# Patient Record
Sex: Female | Born: 1945 | Race: White | Hispanic: No | Smoking: Former smoker
Health system: Southern US, Community
[De-identification: ages and names within clinical notes are randomized; demographics above are authoritative.]

## PROBLEM LIST (undated history)

## (undated) DIAGNOSIS — I96 Gangrene, not elsewhere classified: Secondary | ICD-10-CM

## (undated) DIAGNOSIS — J8410 Pulmonary fibrosis, unspecified: Secondary | ICD-10-CM

## (undated) DIAGNOSIS — R5381 Other malaise: Secondary | ICD-10-CM

## (undated) DIAGNOSIS — C44321 Squamous cell carcinoma of skin of nose: Secondary | ICD-10-CM

## (undated) DIAGNOSIS — S3609XA Other injury of spleen, initial encounter: Secondary | ICD-10-CM

## (undated) DIAGNOSIS — D126 Benign neoplasm of colon, unspecified: Secondary | ICD-10-CM

## (undated) DIAGNOSIS — L719 Rosacea, unspecified: Secondary | ICD-10-CM

## (undated) DIAGNOSIS — A419 Sepsis, unspecified organism: Secondary | ICD-10-CM

## (undated) DIAGNOSIS — M791 Myalgia, unspecified site: Secondary | ICD-10-CM

## (undated) DIAGNOSIS — E785 Hyperlipidemia, unspecified: Secondary | ICD-10-CM

## (undated) DIAGNOSIS — M810 Age-related osteoporosis without current pathological fracture: Secondary | ICD-10-CM

## (undated) DIAGNOSIS — D65 Disseminated intravascular coagulation [defibrination syndrome]: Secondary | ICD-10-CM

## (undated) DIAGNOSIS — D75839 Thrombocytosis, unspecified: Secondary | ICD-10-CM

## (undated) DIAGNOSIS — I10 Essential (primary) hypertension: Secondary | ICD-10-CM

## (undated) DIAGNOSIS — R011 Cardiac murmur, unspecified: Secondary | ICD-10-CM

## (undated) DIAGNOSIS — M86679 Other chronic osteomyelitis, unspecified ankle and foot: Secondary | ICD-10-CM

## (undated) HISTORY — PX: OTHER SURGICAL HISTORY: SHX169

## (undated) HISTORY — PX: BREAST CYST ASPIRATION: SHX578

## (undated) HISTORY — PX: AMPUTATION TOE: SHX6595

## (undated) HISTORY — DX: Squamous cell carcinoma of skin of nose: C44.321

---

## 2002-12-24 HISTORY — PX: OTHER SURGICAL HISTORY: SHX169

## 2006-01-02 ENCOUNTER — Ambulatory Visit: Payer: Self-pay | Admitting: Gastroenterology

## 2008-01-22 ENCOUNTER — Ambulatory Visit: Payer: Self-pay | Admitting: Family Medicine

## 2009-01-31 ENCOUNTER — Ambulatory Visit: Payer: Self-pay | Admitting: Family Medicine

## 2012-03-11 ENCOUNTER — Ambulatory Visit: Payer: Self-pay | Admitting: Family Medicine

## 2012-07-11 ENCOUNTER — Ambulatory Visit: Payer: Self-pay | Admitting: Gastroenterology

## 2012-07-15 LAB — PATHOLOGY REPORT

## 2014-04-23 HISTORY — PX: AMPUTATION FINGER / THUMB: SUR24

## 2014-05-01 DIAGNOSIS — N179 Acute kidney failure, unspecified: Secondary | ICD-10-CM | POA: Diagnosis present

## 2014-06-08 DIAGNOSIS — I10 Essential (primary) hypertension: Secondary | ICD-10-CM | POA: Diagnosis present

## 2014-11-04 ENCOUNTER — Ambulatory Visit: Payer: Self-pay | Admitting: Family Medicine

## 2015-05-03 ENCOUNTER — Other Ambulatory Visit: Payer: Self-pay | Admitting: Family Medicine

## 2015-05-03 DIAGNOSIS — N63 Unspecified lump in unspecified breast: Secondary | ICD-10-CM

## 2015-05-05 ENCOUNTER — Ambulatory Visit
Admission: RE | Admit: 2015-05-05 | Discharge: 2015-05-05 | Disposition: A | Discharge status: Home / Self Care | Payer: Commercial Managed Care - HMO | Source: Ambulatory Visit | Attending: Family Medicine | Admitting: Family Medicine

## 2015-05-05 DIAGNOSIS — R928 Other abnormal and inconclusive findings on diagnostic imaging of breast: Secondary | ICD-10-CM | POA: Diagnosis not present

## 2015-05-05 DIAGNOSIS — N63 Unspecified lump in unspecified breast: Secondary | ICD-10-CM

## 2016-12-24 HISTORY — PX: OTHER SURGICAL HISTORY: SHX169

## 2019-04-26 DIAGNOSIS — E538 Deficiency of other specified B group vitamins: Secondary | ICD-10-CM | POA: Diagnosis present

## 2019-12-25 DIAGNOSIS — C801 Malignant (primary) neoplasm, unspecified: Secondary | ICD-10-CM

## 2019-12-25 HISTORY — DX: Malignant (primary) neoplasm, unspecified: C80.1

## 2020-08-26 ENCOUNTER — Other Ambulatory Visit
Admission: RE | Admit: 2020-08-26 | Discharge: 2020-08-26 | Disposition: A | Discharge status: Home / Self Care | Payer: Medicare Other | Source: Ambulatory Visit | Attending: General Surgery | Admitting: General Surgery

## 2020-08-26 ENCOUNTER — Other Ambulatory Visit: Payer: Self-pay

## 2020-08-26 DIAGNOSIS — Z01818 Encounter for other preprocedural examination: Secondary | ICD-10-CM | POA: Diagnosis present

## 2020-08-26 DIAGNOSIS — Z20822 Contact with and (suspected) exposure to covid-19: Secondary | ICD-10-CM | POA: Insufficient documentation

## 2020-08-27 LAB — SARS CORONAVIRUS 2 (TAT 6-24 HRS): SARS Coronavirus 2: NEGATIVE

## 2020-08-30 ENCOUNTER — Encounter: Payer: Self-pay | Admitting: General Surgery
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
## 2020-08-31 ENCOUNTER — Encounter
Admission: RE | Disposition: A | Discharge status: Home / Self Care | Payer: Self-pay | Source: Home / Self Care | Attending: General Surgery

## 2020-08-31 ENCOUNTER — Other Ambulatory Visit: Payer: Self-pay

## 2020-08-31 ENCOUNTER — Ambulatory Visit
Admission: RE | Admit: 2020-08-31 | Discharge: 2020-08-31 | Disposition: A | Discharge status: Home / Self Care | Payer: Medicare Other | Attending: General Surgery | Admitting: General Surgery

## 2020-08-31 ENCOUNTER — Ambulatory Visit: Payer: Medicare Other | Admitting: Certified Registered"

## 2020-08-31 DIAGNOSIS — E785 Hyperlipidemia, unspecified: Secondary | ICD-10-CM | POA: Diagnosis not present

## 2020-08-31 DIAGNOSIS — Z7983 Long term (current) use of bisphosphonates: Secondary | ICD-10-CM | POA: Insufficient documentation

## 2020-08-31 DIAGNOSIS — I10 Essential (primary) hypertension: Secondary | ICD-10-CM | POA: Insufficient documentation

## 2020-08-31 DIAGNOSIS — Z8371 Family history of colonic polyps: Secondary | ICD-10-CM | POA: Insufficient documentation

## 2020-08-31 DIAGNOSIS — Z86010 Personal history of colonic polyps: Secondary | ICD-10-CM | POA: Insufficient documentation

## 2020-08-31 DIAGNOSIS — Z1211 Encounter for screening for malignant neoplasm of colon: Secondary | ICD-10-CM | POA: Diagnosis not present

## 2020-08-31 DIAGNOSIS — Z79899 Other long term (current) drug therapy: Secondary | ICD-10-CM | POA: Diagnosis not present

## 2020-08-31 DIAGNOSIS — Z89439 Acquired absence of unspecified foot: Secondary | ICD-10-CM | POA: Insufficient documentation

## 2020-08-31 DIAGNOSIS — Z87891 Personal history of nicotine dependence: Secondary | ICD-10-CM | POA: Insufficient documentation

## 2020-08-31 DIAGNOSIS — Z89022 Acquired absence of left finger(s): Secondary | ICD-10-CM | POA: Insufficient documentation

## 2020-08-31 DIAGNOSIS — M1990 Unspecified osteoarthritis, unspecified site: Secondary | ICD-10-CM | POA: Diagnosis not present

## 2020-08-31 DIAGNOSIS — M810 Age-related osteoporosis without current pathological fracture: Secondary | ICD-10-CM | POA: Insufficient documentation

## 2020-08-31 DIAGNOSIS — Z89021 Acquired absence of right finger(s): Secondary | ICD-10-CM | POA: Diagnosis not present

## 2020-08-31 HISTORY — DX: Sepsis, unspecified organism: A41.9

## 2020-08-31 HISTORY — DX: Gangrene, not elsewhere classified: I96

## 2020-08-31 HISTORY — DX: Pulmonary fibrosis, unspecified: J84.10

## 2020-08-31 HISTORY — DX: Other malaise: R53.81

## 2020-08-31 HISTORY — DX: Benign neoplasm of colon, unspecified: D12.6

## 2020-08-31 HISTORY — DX: Age-related osteoporosis without current pathological fracture: M81.0

## 2020-08-31 HISTORY — DX: Other injury of spleen, initial encounter: S36.09XA

## 2020-08-31 HISTORY — PX: COLONOSCOPY WITH PROPOFOL: SHX5780

## 2020-08-31 HISTORY — DX: Myalgia, unspecified site: M79.10

## 2020-08-31 HISTORY — DX: Disseminated intravascular coagulation (defibrination syndrome): D65

## 2020-08-31 HISTORY — DX: Thrombocytosis, unspecified: D75.839

## 2020-08-31 HISTORY — DX: Rosacea, unspecified: L71.9

## 2020-08-31 HISTORY — DX: Other chronic osteomyelitis, unspecified ankle and foot: M86.679

## 2020-08-31 HISTORY — DX: Hyperlipidemia, unspecified: E78.5

## 2020-08-31 SURGERY — COLONOSCOPY WITH PROPOFOL
Anesthesia: General

## 2020-08-31 MED ORDER — PROPOFOL 500 MG/50ML IV EMUL
INTRAVENOUS | Status: DC | PRN
  Administered 2020-08-31: 125 ug/kg/min via INTRAVENOUS

## 2020-08-31 MED ORDER — PROPOFOL 500 MG/50ML IV EMUL
INTRAVENOUS | Status: AC
  Filled 2020-08-31: qty 50

## 2020-08-31 MED ORDER — PROPOFOL 10 MG/ML IV BOLUS
INTRAVENOUS | Status: DC | PRN
  Administered 2020-08-31: 50 mg via INTRAVENOUS

## 2020-08-31 MED ORDER — LIDOCAINE HCL (CARDIAC) PF 100 MG/5ML IV SOSY
PREFILLED_SYRINGE | INTRAVENOUS | Status: DC | PRN
  Administered 2020-08-31: 50 mg via INTRAVENOUS

## 2020-08-31 MED ORDER — SODIUM CHLORIDE 0.9 % IV SOLN
INTRAVENOUS | Status: DC
  Administered 2020-08-31: 20 mL/h via INTRAVENOUS

## 2020-08-31 NOTE — Anesthesia Postprocedure Evaluation (Signed)
Anesthesia Post Note  Patient: Connie Doyle  Procedure(s) Performed: COLONOSCOPY WITH PROPOFOL (N/A )  Patient location during evaluation: Endoscopy Anesthesia Type: General Level of consciousness: awake and alert Pain management: pain level controlled Vital Signs Assessment: post-procedure vital signs reviewed and stable Respiratory status: spontaneous breathing and respiratory function stable Cardiovascular status: stable Anesthetic complications: no   No complications documented.   Last Vitals:  Vitals:   08/31/20 0939 08/31/20 0944  BP: (!) 122/56 (!) 127/56  Pulse: (!) 55 (!) 56  Resp: 19 16  Temp:    SpO2: 99% 100%    Last Pain:  Vitals:   08/31/20 0944  TempSrc:   PainSc: 0-No pain                 [name]

## 2020-08-31 NOTE — Op Note (Signed)
[facility] Gastroenterology Patient Name: Connie Doyle Procedure Date: 08/31/2020 8:49 AM MRN: [id number] Account #: [id number] Date of Birth: [birth year] Admit Type: Inpatient Age: 74 Room: [facility] ENDO ROOM 1 Gender: Female Note Status: Finalized Procedure:             Colonoscopy Indications:           High risk colon cancer surveillance: Personal history                         of colonic polyps Providers:             Robert Bellow, MD Referring MD:          Shirline Frees MD, MD (Referring MD) Medicines:             Monitored Anesthesia Care Complications:         No immediate complications. Procedure:             Pre-Anesthesia Assessment:                        - Prior to the procedure, a History and Physical was                         performed, and patient medications, allergies and                         sensitivities were reviewed. The patient's tolerance                         of previous anesthesia was reviewed.                        - The risks and benefits of the procedure and the                         sedation options and risks were discussed with the                         patient. All questions were answered and informed                         consent was obtained.                        After obtaining informed consent, the colonoscope was                         passed under direct vision. Throughout the procedure,                         the patient's blood pressure, pulse, and oxygen                         saturations were monitored continuously. The                         Colonoscope was introduced through the anus and                         advanced to the the  terminal ileum. The colonoscopy                         was performed without difficulty. The patient                         tolerated the procedure well. The quality of the bowel                         preparation was excellent. Findings:      A single medium-sized  localized angioectasia without bleeding was found       in the cecum. Impression:            - The entire examined colon is normal on direct and                         retroflexion views.                        - The entire examined colon is normal on direct and                         retroflexion views.                        - No specimens collected. Recommendation:        - Repeat colonoscopy in 5 years for surveillance. Procedure Code(s):     --- Professional ---                        [phone], Colonoscopy, flexible; diagnostic, including                         collection of specimen(s) by brushing or washing, when                         performed (separate procedure) Diagnosis Code(s):     --- Professional ---                        Z86.010, Personal history of colonic polyps CPT copyright 2019 American Medical Association. All rights reserved. The codes documented in this report are preliminary and upon coder review may  be revised to meet current compliance requirements. Robert Bellow, MD 08/31/2020 9:27:00 AM This report has been signed electronically. Number of Addenda: 0 Note Initiated On: 08/31/2020 8:49 AM Scope Withdrawal Time: 0 hours 7 minutes 28 seconds  Total Procedure Duration: 0 hours 16 minutes 20 seconds  Estimated Blood Loss:  Estimated blood loss: none.      [facility]

## 2020-08-31 NOTE — Anesthesia Procedure Notes (Signed)
Date/Time: 08/31/2020 9:01 AM Performed by: Jerrye Noble, CRNA Pre-anesthesia Checklist: Patient identified, Emergency Drugs available, Suction available and Patient being monitored Patient Re-evaluated:Patient Re-evaluated prior to induction Oxygen Delivery Method: Nasal cannula

## 2020-08-31 NOTE — Anesthesia Preprocedure Evaluation (Signed)
Anesthesia Evaluation  Patient identified by MRN, date of birth, ID band Patient awake    Reviewed: Allergy & Precautions, NPO status , Patient's Chart, lab work & pertinent test results  History of Anesthesia Complications Negative for: history of anesthetic complications  Airway Mallampati: II       Dental   Pulmonary neg sleep apnea, neg COPD, Not current smoker, former smoker,           Cardiovascular (-) hypertension(-) Past MI and (-) CHF (-) dysrhythmias (-) Valvular Problems/Murmurs     Neuro/Psych neg Seizures    GI/Hepatic Neg liver ROS, neg GERD  ,  Endo/Other  neg diabetes  Renal/GU negative Renal ROS     Musculoskeletal   Abdominal   Peds  Hematology   Anesthesia Other Findings   Reproductive/Obstetrics                             Anesthesia Physical Anesthesia Plan  ASA: II  Anesthesia Plan: General   Post-op Pain Management:    Induction: Intravenous  PONV Risk Score and Plan: 3 and Propofol infusion, TIVA and Treatment may vary due to age or medical condition  Airway Management Planned: Nasal Cannula  Additional Equipment:   Intra-op Plan:   Post-operative Plan:   Informed Consent: I have reviewed the patients History and Physical, chart, labs and discussed the procedure including the risks, benefits and alternatives for the proposed anesthesia with the patient or authorized representative who has indicated his/her understanding and acceptance.       Plan Discussed with:   Anesthesia Plan Comments:         Anesthesia Quick Evaluation

## 2020-08-31 NOTE — Transfer of Care (Signed)
Immediate Anesthesia Transfer of Care Note  Patient: Connie Doyle  Procedure(s) Performed: COLONOSCOPY WITH PROPOFOL (N/A )  Patient Location: PACU and Endoscopy Unit  Anesthesia Type:General  Level of Consciousness: awake and drowsy  Airway & Oxygen Therapy: Patient Spontanous Breathing  Post-op Assessment: Report given to RN and Post -op Vital signs reviewed and stable  Post vital signs: Reviewed and stable  Last Vitals:  Vitals Value Taken Time  BP 96/44 08/31/20 0929  Temp 36.6 C 08/31/20 0929  Pulse 60 08/31/20 0929  Resp 18 08/31/20 0929  SpO2 100 % 08/31/20 0929    Last Pain:  Vitals:   08/31/20 0833  TempSrc: Temporal  PainSc: 1          Complications: No complications documented.
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
## 2020-09-01 ENCOUNTER — Encounter: Payer: Self-pay | Admitting: General Surgery

## 2021-07-31 DIAGNOSIS — G8929 Other chronic pain: Secondary | ICD-10-CM | POA: Insufficient documentation

## 2021-08-08 ENCOUNTER — Other Ambulatory Visit: Payer: Self-pay | Admitting: Physical Medicine & Rehabilitation

## 2021-08-08 DIAGNOSIS — G8929 Other chronic pain: Secondary | ICD-10-CM

## 2021-08-08 DIAGNOSIS — M5442 Lumbago with sciatica, left side: Secondary | ICD-10-CM

## 2021-08-15 ENCOUNTER — Ambulatory Visit
Admission: RE | Admit: 2021-08-15 | Discharge: 2021-08-15 | Disposition: A | Discharge status: Home / Self Care | Payer: Medicare Other | Source: Ambulatory Visit | Attending: Physical Medicine & Rehabilitation | Admitting: Physical Medicine & Rehabilitation

## 2021-08-15 DIAGNOSIS — G8929 Other chronic pain: Secondary | ICD-10-CM | POA: Insufficient documentation

## 2021-08-15 DIAGNOSIS — M5442 Lumbago with sciatica, left side: Secondary | ICD-10-CM | POA: Insufficient documentation

## 2021-10-30 DIAGNOSIS — R413 Other amnesia: Secondary | ICD-10-CM | POA: Insufficient documentation

## 2021-10-31 ENCOUNTER — Other Ambulatory Visit: Payer: Self-pay | Admitting: Gerontology

## 2021-10-31 DIAGNOSIS — Z1231 Encounter for screening mammogram for malignant neoplasm of breast: Secondary | ICD-10-CM

## 2021-12-24 HISTORY — PX: SKIN CANCER EXCISION: SHX779

## 2022-03-19 ENCOUNTER — Other Ambulatory Visit: Payer: Self-pay | Admitting: Neurosurgery

## 2022-03-19 DIAGNOSIS — Z01818 Encounter for other preprocedural examination: Secondary | ICD-10-CM

## 2022-03-27 ENCOUNTER — Observation Stay
Admission: EM | Admit: 2022-03-27 | Discharge: 2022-03-28 | Disposition: A | Discharge status: Home / Self Care | Payer: Medicare Other | Attending: Internal Medicine | Admitting: Internal Medicine

## 2022-03-27 ENCOUNTER — Emergency Department: Payer: Medicare Other

## 2022-03-27 ENCOUNTER — Other Ambulatory Visit: Payer: Self-pay

## 2022-03-27 ENCOUNTER — Observation Stay: Payer: Medicare Other

## 2022-03-27 ENCOUNTER — Encounter: Payer: Self-pay | Admitting: Emergency Medicine

## 2022-03-27 DIAGNOSIS — R259 Unspecified abnormal involuntary movements: Secondary | ICD-10-CM | POA: Diagnosis present

## 2022-03-27 DIAGNOSIS — R410 Disorientation, unspecified: Secondary | ICD-10-CM | POA: Diagnosis not present

## 2022-03-27 DIAGNOSIS — E538 Deficiency of other specified B group vitamins: Secondary | ICD-10-CM | POA: Diagnosis not present

## 2022-03-27 DIAGNOSIS — Z87891 Personal history of nicotine dependence: Secondary | ICD-10-CM | POA: Insufficient documentation

## 2022-03-27 DIAGNOSIS — R748 Abnormal levels of other serum enzymes: Secondary | ICD-10-CM | POA: Insufficient documentation

## 2022-03-27 DIAGNOSIS — I10 Essential (primary) hypertension: Secondary | ICD-10-CM | POA: Diagnosis not present

## 2022-03-27 DIAGNOSIS — N178 Other acute kidney failure: Secondary | ICD-10-CM | POA: Diagnosis not present

## 2022-03-27 DIAGNOSIS — N179 Acute kidney failure, unspecified: Secondary | ICD-10-CM | POA: Diagnosis present

## 2022-03-27 DIAGNOSIS — E559 Vitamin D deficiency, unspecified: Secondary | ICD-10-CM | POA: Diagnosis not present

## 2022-03-27 DIAGNOSIS — Z79899 Other long term (current) drug therapy: Secondary | ICD-10-CM | POA: Diagnosis not present

## 2022-03-27 LAB — URINE DRUG SCREEN, QUALITATIVE (ARMC ONLY)
Amphetamines, Ur Screen: NOT DETECTED
Barbiturates, Ur Screen: NOT DETECTED
Benzodiazepine, Ur Scrn: NOT DETECTED
Cannabinoid 50 Ng, Ur ~~LOC~~: NOT DETECTED
Cocaine Metabolite,Ur ~~LOC~~: NOT DETECTED
MDMA (Ecstasy)Ur Screen: NOT DETECTED
Methadone Scn, Ur: NOT DETECTED
Opiate, Ur Screen: NOT DETECTED
Phencyclidine (PCP) Ur S: NOT DETECTED
Tricyclic, Ur Screen: NOT DETECTED

## 2022-03-27 LAB — URINALYSIS, ROUTINE W REFLEX MICROSCOPIC
Bilirubin Urine: NEGATIVE
Glucose, UA: NEGATIVE mg/dL
Hgb urine dipstick: NEGATIVE
Ketones, ur: NEGATIVE mg/dL
Leukocytes,Ua: NEGATIVE
Nitrite: NEGATIVE
Protein, ur: NEGATIVE mg/dL
Specific Gravity, Urine: 1.008 (ref 1.005–1.030)
pH: 7 (ref 5.0–8.0)

## 2022-03-27 LAB — COMPREHENSIVE METABOLIC PANEL
ALT: 31 U/L (ref 0–44)
AST: 38 U/L (ref 15–41)
Albumin: 4.3 g/dL (ref 3.5–5.0)
Alkaline Phosphatase: 54 U/L (ref 38–126)
Anion gap: 11 (ref 5–15)
BUN: 13 mg/dL (ref 8–23)
CO2: 27 mmol/L (ref 22–32)
Calcium: 9.3 mg/dL (ref 8.9–10.3)
Chloride: 99 mmol/L (ref 98–111)
Creatinine, Ser: 1.08 mg/dL — ABNORMAL HIGH (ref 0.44–1.00)
GFR, Estimated: 54 mL/min — ABNORMAL LOW (ref >60)
Glucose, Bld: 133 mg/dL — ABNORMAL HIGH (ref 70–99)
Potassium: 3.9 mmol/L (ref 3.5–5.1)
Sodium: 137 mmol/L (ref 135–145)
Total Bilirubin: 0.8 mg/dL (ref 0.3–1.2)
Total Protein: 7.6 g/dL (ref 6.5–8.1)

## 2022-03-27 LAB — CBC WITH DIFFERENTIAL/PLATELET
Abs Immature Granulocytes: 0.02 10*3/uL (ref 0.00–0.07)
Basophils Absolute: 0 10*3/uL (ref 0.0–0.1)
Basophils Relative: 1 %
Eosinophils Absolute: 0.1 10*3/uL (ref 0.0–0.5)
Eosinophils Relative: 2 %
HCT: 35.6 % — ABNORMAL LOW (ref 36.0–46.0)
Hemoglobin: 12 g/dL (ref 12.0–15.0)
Immature Granulocytes: 0 %
Lymphocytes Relative: 35 %
Lymphs Abs: 1.8 10*3/uL (ref 0.7–4.0)
MCH: 31.8 pg (ref 26.0–34.0)
MCHC: 33.7 g/dL (ref 30.0–36.0)
MCV: 94.4 fL (ref 80.0–100.0)
Monocytes Absolute: 0.5 10*3/uL (ref 0.1–1.0)
Monocytes Relative: 9 %
Neutro Abs: 2.7 10*3/uL (ref 1.7–7.7)
Neutrophils Relative %: 53 %
Platelets: 317 10*3/uL (ref 150–400)
RBC: 3.77 MIL/uL — ABNORMAL LOW (ref 3.87–5.11)
RDW: 13.2 % (ref 11.5–15.5)
WBC: 5.1 10*3/uL (ref 4.0–10.5)
nRBC: 0 % (ref 0.0–0.2)

## 2022-03-27 LAB — ETHANOL: Alcohol, Ethyl (B): <10 mg/dL (ref <10)

## 2022-03-27 LAB — T4, FREE: Free T4: 1.13 ng/dL — ABNORMAL HIGH (ref 0.61–1.12)

## 2022-03-27 LAB — SEDIMENTATION RATE: Sed Rate: 17 mm/hr (ref 0–30)

## 2022-03-27 LAB — CK: Total CK: 322 U/L — ABNORMAL HIGH (ref 38–234)

## 2022-03-27 LAB — TSH: TSH: 1.847 u[IU]/mL (ref 0.350–4.500)

## 2022-03-27 LAB — MAGNESIUM: Magnesium: 2 mg/dL (ref 1.7–2.4)

## 2022-03-27 MED ORDER — LOSARTAN POTASSIUM 25 MG PO TABS
25.0000 mg | ORAL_TABLET | Freq: Every day | ORAL | Status: DC
  Administered 2022-03-28: 25 mg via ORAL
  Filled 2022-03-27: qty 1

## 2022-03-27 MED ORDER — LACTATED RINGERS IV SOLN: INTRAVENOUS | Status: DC

## 2022-03-27 MED ORDER — GADOBUTROL 1 MMOL/ML IV SOLN
6.0000 mL | Freq: Once | INTRAVENOUS | Status: AC | PRN
  Administered 2022-03-27: 6 mL via INTRAVENOUS

## 2022-03-27 MED ORDER — PRAVASTATIN SODIUM 20 MG PO TABS
40.0000 mg | ORAL_TABLET | Freq: Every day | ORAL | Status: DC
  Administered 2022-03-28: 40 mg via ORAL
  Filled 2022-03-27: qty 2

## 2022-03-27 MED ORDER — THIAMINE HCL 100 MG/ML IJ SOLN
500.0000 mg | Freq: Every day | INTRAVENOUS | Status: DC
  Administered 2022-03-27 – 2022-03-28 (×2): 500 mg via INTRAVENOUS
  Filled 2022-03-27 (×2): qty 5

## 2022-03-27 MED ORDER — PANTOPRAZOLE SODIUM 40 MG IV SOLR
40.0000 mg | Freq: Two times a day (BID) | INTRAVENOUS | Status: DC
  Administered 2022-03-27: 40 mg via INTRAVENOUS
  Filled 2022-03-27 (×2): qty 10

## 2022-03-27 MED ORDER — ACETAMINOPHEN 325 MG PO TABS
650.0000 mg | ORAL_TABLET | Freq: Four times a day (QID) | ORAL | Status: DC | PRN
  Administered 2022-03-27 – 2022-03-28 (×2): 650 mg via ORAL
  Filled 2022-03-27 (×2): qty 2

## 2022-03-27 MED ORDER — ACETAMINOPHEN 650 MG RE SUPP: 650.0000 mg | Freq: Four times a day (QID) | RECTAL | Status: DC | PRN

## 2022-03-27 MED ORDER — HEPARIN SODIUM (PORCINE) 5000 UNIT/ML IJ SOLN
5000.0000 [IU] | Freq: Three times a day (TID) | INTRAMUSCULAR | Status: DC
  Administered 2022-03-27 – 2022-03-28 (×2): 5000 [IU] via SUBCUTANEOUS
  Filled 2022-03-27 (×2): qty 1

## 2022-03-27 MED ORDER — SODIUM CHLORIDE 0.9% FLUSH
3.0000 mL | Freq: Two times a day (BID) | INTRAVENOUS | Status: DC
  Administered 2022-03-27 – 2022-03-28 (×2): 3 mL via INTRAVENOUS

## 2022-03-27 MED ORDER — DIAZEPAM 2 MG PO TABS
1.0000 mg | ORAL_TABLET | Freq: Three times a day (TID) | ORAL | Status: DC | PRN
  Administered 2022-03-27 – 2022-03-28 (×2): 1 mg via ORAL
  Filled 2022-03-27 (×2): qty 1

## 2022-03-27 MED ORDER — LOSARTAN POTASSIUM 50 MG PO TABS: 50.0000 mg | ORAL_TABLET | Freq: Every day | ORAL | Status: DC

## 2022-03-27 NOTE — Assessment & Plan Note (Signed)
We will check b12 level.  ?
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
## 2022-03-27 NOTE — ED Notes (Signed)
Patient transported to MRI 

## 2022-03-27 NOTE — ED Triage Notes (Signed)
Pt to ED via POV, pt sent from [facility] clinic acute care. Pt family states that pt started having uncontrollable movement that started about 5 hours ago. Pt daughter states that pt seems disoriented to her, pt is able to answer orientation question correctly but does take some time to say what year it is. Pt states that feels like her mind is ok but she just can't stop jerking. Pt daughter states that she had a similar episode a few weeks ago that lasted about 1 hour. Pt states that during that episode she felt popping in her ear as well. Pt is in NAD ?

## 2022-03-27 NOTE — ED Notes (Signed)
Pt advised that we need urine sample when she is able to provide one. Pt given cup ?

## 2022-03-27 NOTE — Assessment & Plan Note (Signed)
Blood pressure 114/62, pulse 78, temperature 98.2 ?F (36.8 ?C), temperature source Oral, resp. rate 16, height '5\' 1"'$  (1.549 m), weight 61.2 kg, SpO2 98 %. ?we will decrease losartan at 25 mg. ? ?

## 2022-03-27 NOTE — ED Provider Notes (Addendum)
? ?[facility] ?Provider Note ? ? ? Event Date/Time  ? First MD Initiated Contact with Patient 03/27/22 1626   ?  (approximate) ? ? ?History  ? ?uncontrollable movement ? ? ?HPI ? ?Connie Doyle is a 76 y.o. female  with this morning having abnormal movements.  Woke up this morning and felt her normal self.  Then she developed the uncontrollable movements of her arms around 11am. She says she cant control the movements.  This is different from baseline. Reports some dizzyness as well with it. No headaches, vision changes, nausea vomiting, diarrhea. No history of movements like this.  Did up her dose of baclofen to '20mg'$  about a month ago.  ? ?Patient's daughter who is with her and reports that she seems a bit more confused than normal and the movements that got better than they were earlier but they are still present. ? ?Patient does report that she does drink 1-2 large beers most days.  She denies any drug use. ? 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  ? ?  ? ? ?Physical Exam  ? ?Triage Vital Signs: ?ED Triage Vitals  ?Enc Vitals Group  ?   BP 03/27/22 1515 119/61  ?   Pulse Rate 03/27/22 1515 82  ?   Resp 03/27/22 1515 16  ?   Temp 03/27/22 1515 98.2 ?F (36.8 ?C)  ?   Temp Source 03/27/22 1515 Oral  ?   SpO2 03/27/22 1515 96 %  ?   Weight 03/27/22 1516 135 lb (61.2 kg)  ?   Height 03/27/22 1516 '5\' 1"'$  (1.549 m)  ?   Head Circumference --   ?   Peak Flow --   ?   Pain Score 03/27/22 1516 0  ?   Pain Loc --   ?   Pain Edu? --   ?   Excl. in [place]? --   ? ? ?Most recent vital signs: ?Vitals:  ? 03/27/22 1515  ?BP: 119/61  ?Pulse: 82  ?Resp: 16  ?Temp: 98.2 ?F (36.8 ?C)  ?SpO2: 96%  ? ? ? ?General: Awake, no distress.  ?CV:  Good peripheral perfusion.  ?Resp:  Normal effort.  ?Abd:  No distention.  ?Other:  CN intact,  ?AO x3, full strength in arms with intention cranial nerves II through XII are intact.  Patient does have some spontaneous movement of her arms and legs. ? ? ? ?ED Results  / Procedures / Treatments  ? ?Labs ?(all labs ordered are listed, but only abnormal results are displayed) ?Labs Reviewed  ?CBC WITH DIFFERENTIAL/PLATELET - Abnormal; Notable for the following components:  ?    Result Value  ? RBC 3.77 (*)   ? HCT 35.6 (*)   ? All other components within normal limits  ?COMPREHENSIVE METABOLIC PANEL - Abnormal; Notable for the following components:  ? Glucose, Bld 133 (*)   ? Creatinine, Ser 1.08 (*)   ? GFR, Estimated 54 (*)   ? All other components within normal limits  ?CK - Abnormal; Notable for the following components:  ? Total CK 322 (*)   ? All other components within normal limits  ?T4, FREE - Abnormal; Notable for the following components:  ? Free T4 1.13 (*)   ? All other components within normal limits  ?MAGNESIUM  ?TSH  ?ETHANOL  ?URINALYSIS, ROUTINE W REFLEX MICROSCOPIC  ?URINE DRUG SCREEN, QUALITATIVE ([facility] ONLY)  ? ? ? ?EKG ? ?My interpretation of EKG: ? ?Afib  with rate of 105, no st elevation, no twi, normal intervals.  ? ?RADIOLOGY ?I have reviewed the CT head personally no evidence of intracranial hemorrhage ? ?PROCEDURES: ? ?Critical Care performed: No ? ?Procedures ? ? ?MEDICATIONS ORDERED IN ED: ?Medications - No data to display ? ? ?IMPRESSION / MDM / ASSESSMENT AND PLAN / ED COURSE  ?I reviewed the triage vital signs and the nursing notes. ?             ?               ? ?Patient comes in with uncontrollable jerking movement.  Not sure this could be related to medications versus electrolyte divorces versus stroke versus seizure. ? ? ?CBC normal ?CMP CR slightly elevated  ?Mag normal  ?CK slightly elevated  ?TSH normal t4 slightly elevated  ?Etoh negative ? ?Work-up so far is reassuring.  Discussed the case with Dr. Shirlean Schlein from neurology who recommends admission for MRI, further blood work to monitor patient.  Also recommended holding gabapentin and baclofen as these could be contributing. ? ?Hospitalist is requesting I   add  a psychiatry consult. Will place  consult  ? ? ? ? ? ?FINAL CLINICAL IMPRESSION(S) / ED DIAGNOSES  ? ?Final diagnoses:  ?Abnormal movement  ? ? ? ?Rx / DC Orders  ? ?ED Discharge Orders   ? ? None  ? ?  ? ? ? ?Note:  This document was prepared using Dragon voice recognition software and may include unintentional dictation errors. ?  ?Vanessa Morrison, MD ?03/27/22 1850 ? ?  ?Vanessa Putney, MD ?03/27/22 1926 ? ?

## 2022-03-27 NOTE — Assessment & Plan Note (Signed)
Pt coming in for bl ue choreiform movements. ?She is alert awake but poor historian./ ?D/d in her case with her neck pain includes c spine djd and or spinal stenosis from compression fractures./  ?D/d  include psychiatric issues ,D/d include focal seizures or TIA, we will admit pt and evaluate thoroughly and get psych consult as well.  ?Vit b/ d def and inflammatory etiologies.  ?Greatly appreciate neurology consult and management.  ?Will consider spep/ upepe/ copper/ iron levels.  ?Prn valium for her anxiety as well.  ?  ?

## 2022-03-27 NOTE — Assessment & Plan Note (Signed)
Lab Results  ?Component Value Date  ? CREATININE 1.08 (H) 03/27/2022  ?we will follow and monitor and gentle Hydration overnight.  ? ?
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
## 2022-03-28 DIAGNOSIS — R259 Unspecified abnormal involuntary movements: Secondary | ICD-10-CM | POA: Diagnosis not present

## 2022-03-28 DIAGNOSIS — I10 Essential (primary) hypertension: Secondary | ICD-10-CM

## 2022-03-28 DIAGNOSIS — R748 Abnormal levels of other serum enzymes: Secondary | ICD-10-CM | POA: Diagnosis present

## 2022-03-28 LAB — CBC
HCT: 36.6 % (ref 36.0–46.0)
Hemoglobin: 12.2 g/dL (ref 12.0–15.0)
MCH: 31.6 pg (ref 26.0–34.0)
MCHC: 33.3 g/dL (ref 30.0–36.0)
MCV: 94.8 fL (ref 80.0–100.0)
Platelets: 314 10*3/uL (ref 150–400)
RBC: 3.86 MIL/uL — ABNORMAL LOW (ref 3.87–5.11)
RDW: 13.4 % (ref 11.5–15.5)
WBC: 4.5 10*3/uL (ref 4.0–10.5)
nRBC: 0 % (ref 0.0–0.2)

## 2022-03-28 LAB — COMPREHENSIVE METABOLIC PANEL
ALT: 27 U/L (ref 0–44)
AST: 32 U/L (ref 15–41)
Albumin: 4.2 g/dL (ref 3.5–5.0)
Alkaline Phosphatase: 49 U/L (ref 38–126)
Anion gap: 8 (ref 5–15)
BUN: 15 mg/dL (ref 8–23)
CO2: 27 mmol/L (ref 22–32)
Calcium: 9 mg/dL (ref 8.9–10.3)
Chloride: 107 mmol/L (ref 98–111)
Creatinine, Ser: 1.09 mg/dL — ABNORMAL HIGH (ref 0.44–1.00)
GFR, Estimated: 53 mL/min — ABNORMAL LOW (ref >60)
Glucose, Bld: 80 mg/dL (ref 70–99)
Potassium: 3.9 mmol/L (ref 3.5–5.1)
Sodium: 142 mmol/L (ref 135–145)
Total Bilirubin: 0.7 mg/dL (ref 0.3–1.2)
Total Protein: 7.2 g/dL (ref 6.5–8.1)

## 2022-03-28 LAB — VITAMIN B12: Vitamin B-12: 405 pg/mL (ref 180–914)

## 2022-03-28 LAB — FERRITIN: Ferritin: 28 ng/mL (ref 11–307)

## 2022-03-28 LAB — CK: Total CK: 263 U/L — ABNORMAL HIGH (ref 38–234)

## 2022-03-28 LAB — TSH: TSH: 2.12 u[IU]/mL (ref 0.350–4.500)

## 2022-03-28 MED ORDER — HYDRALAZINE HCL 20 MG/ML IJ SOLN
10.0000 mg | Freq: Once | INTRAMUSCULAR | Status: AC
  Administered 2022-03-28: 10 mg via INTRAVENOUS
  Filled 2022-03-28: qty 1

## 2022-03-28 MED ORDER — LORAZEPAM 1 MG PO TABS
1.0000 mg | ORAL_TABLET | Freq: Once | ORAL | Status: AC
  Administered 2022-03-28: 1 mg via ORAL
  Filled 2022-03-28: qty 1

## 2022-03-28 MED ORDER — PANTOPRAZOLE SODIUM 40 MG PO TBEC: 40.0000 mg | DELAYED_RELEASE_TABLET | Freq: Two times a day (BID) | ORAL | Status: DC

## 2022-03-28 MED ORDER — BACLOFEN 10 MG PO TABS: 10.0000 mg | ORAL_TABLET | Freq: Three times a day (TID) | ORAL | 0 refills | Status: DC | PRN

## 2022-03-28 MED ORDER — MELOXICAM 7.5 MG PO TABS
7.5000 mg | ORAL_TABLET | Freq: Two times a day (BID) | ORAL | Status: DC
  Administered 2022-03-28: 7.5 mg via ORAL
  Filled 2022-03-28: qty 1

## 2022-03-28 MED ORDER — BACLOFEN 10 MG PO TABS: 20.0000 mg | ORAL_TABLET | Freq: Three times a day (TID) | ORAL | Status: DC | PRN

## 2022-03-28 NOTE — Assessment & Plan Note (Signed)
No reports of trauma. ?We will continue with gentle hydration overnight. ?
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
## 2022-03-28 NOTE — Plan of Care (Signed)

## 2022-03-28 NOTE — Plan of Care (Signed)
?  Problem: Education: ?Goal: Knowledge of General Education information will improve ?Description: Including pain rating scale, medication(s)/side effects and non-pharmacologic comfort measures ?03/28/2022 1401 by Connie Doyle, Connie Flaming, Connie Doyle ?Outcome: Adequate for Discharge ?03/28/2022 0800 by Kitty Cadavid, Connie Flaming, Connie Doyle ?Outcome: Progressing ?  ?Problem: Health Behavior/Discharge Planning: ?Goal: Ability to manage health-related needs will improve ?03/28/2022 1401 by Connie Doyle, Connie Flaming, Connie Doyle ?Outcome: Adequate for Discharge ?03/28/2022 0800 by Meng Winterton, Connie Flaming, Connie Doyle ?Outcome: Progressing ?  ?Problem: Clinical Measurements: ?Goal: Ability to maintain clinical measurements within normal limits will improve ?03/28/2022 1401 by Connie Doyle, Connie Flaming, Connie Doyle ?Outcome: Adequate for Discharge ?03/28/2022 0800 by Nikelle Malatesta, Connie Flaming, Connie Doyle ?Outcome: Progressing ?Goal: Will remain free from infection ?03/28/2022 1401 by Connie Doyle, Connie Flaming, Connie Doyle ?Outcome: Adequate for Discharge ?03/28/2022 0800 by Connie Doyle, Connie Flaming, Connie Doyle ?Outcome: Progressing ?Goal: Diagnostic test results will improve ?03/28/2022 1401 by Connie Doyle, Connie Flaming, Connie Doyle ?Outcome: Adequate for Discharge ?03/28/2022 0800 by Connie Doyle, Connie Flaming, Connie Doyle ?Outcome: Progressing ?Goal: Respiratory complications will improve ?03/28/2022 1401 by Zyanya Glaza, Connie Flaming, Connie Doyle ?Outcome: Adequate for Discharge ?03/28/2022 0800 by Connie Doyle, Connie Flaming, Connie Doyle ?Outcome: Progressing ?Goal: Cardiovascular complication will be avoided ?03/28/2022 1401 by Connie Doyle, Connie Flaming, Connie Doyle ?Outcome: Adequate for Discharge ?03/28/2022 0800 by Nijel Flink, Connie Flaming, Connie Doyle ?Outcome: Progressing ?  ?Problem: Activity: ?Goal: Risk for activity intolerance will decrease ?03/28/2022 1401 by Rasa Degrazia, Connie Flaming, Connie Doyle ?Outcome: Adequate for Discharge ?03/28/2022 0800 by Kaisei Gilbo, Connie Flaming, Connie Doyle ?Outcome: Progressing ?  ?Problem: Nutrition: ?Goal: Adequate nutrition will be maintained ?03/28/2022 1401 by Juna Caban, Connie Flaming, Connie Doyle ?Outcome: Adequate for Discharge ?03/28/2022 0800 by Jerimie Mancuso, Connie Flaming, Connie Doyle ?Outcome: Progressing ?  ?Problem: Coping: ?Goal: Level of anxiety will decrease ?03/28/2022 1401 by Ksean Vale, Connie Flaming, Connie Doyle ?Outcome: Adequate for Discharge ?03/28/2022 0800 by Kirstine Jacquin, Connie Flaming, Connie Doyle ?Outcome: Progressing ?  ?Problem: Elimination: ?Goal: Will not experience complications related to bowel motility ?03/28/2022 1401 by Song Myre, Connie Flaming, Connie Doyle ?Outcome: Adequate for Discharge ?03/28/2022 0800 by Artem Bunte, Connie Flaming, Connie Doyle ?Outcome: Progressing ?Goal: Will not experience complications related to urinary retention ?03/28/2022 1401 by Anastasija Anfinson, Connie Flaming, Connie Doyle ?Outcome: Adequate for Discharge ?03/28/2022 0800 by Ekin Pilar, Connie Flaming, Connie Doyle ?Outcome: Progressing ?  ?Problem: Pain Managment: ?Goal: General experience of comfort will improve ?03/28/2022 1401 by Rudy Domek, Connie Flaming, Connie Doyle ?Outcome: Adequate for Discharge ?03/28/2022 0800 by Imonie Tuch, Connie Flaming, Connie Doyle ?Outcome: Progressing ?  ?Problem: Safety: ?Goal: Ability to remain free from injury will improve ?03/28/2022 1401 by Jaydrien Wassenaar, Connie Flaming, Connie Doyle ?Outcome: Adequate for Discharge ?03/28/2022 0800 by Mehlani Blankenburg, Connie Flaming, Connie Doyle ?Outcome: Progressing ?  ?Problem: Skin Integrity: ?Goal: Risk for impaired skin integrity will decrease ?03/28/2022 1401 by Kaylob Wallen, Connie Flaming, Connie Doyle ?Outcome: Adequate for Discharge ?03/28/2022 0800 by Darrick Greenlaw, Connie Flaming, Connie Doyle ?Outcome: Progressing ?  ?

## 2022-03-29 NOTE — Progress Notes (Signed)
?   03/28/22 1112  ?Assess: MEWS Score  ?Temp 98.1 ?F (36.7 ?C)  ?BP (!) 203/88  ?Pulse Rate 66  ?Resp 16  ?SpO2 95 %  ?O2 Device Room Air  ?Assess: MEWS Score  ?MEWS Temp 0  ?MEWS Systolic 2  ?MEWS Pulse 0  ?MEWS RR 0  ?MEWS LOC 0  ?MEWS Score 2  ?MEWS Score Color Yellow  ?Assess: if the MEWS score is Yellow or Red  ?Were vital signs taken at a resting state? Yes  ?Focused Assessment No change from prior assessment  ?Does the patient meet 2 or more of the SIRS criteria? No  ?MEWS guidelines implemented *See Row Information* Yes  ?Treat  ?MEWS Interventions Administered scheduled meds/treatments  ?Pain Scale 0-10  ?Take Vital Signs  ?Increase Vital Sign Frequency  Yellow: Q 2hr X 2 then Q 4hr X 2, if remains yellow, continue Q 4hrs  ?Escalate  ?MEWS: Escalate Yellow: discuss with charge nurse/RN and consider discussing with provider and RRT  ?Notify: Charge Nurse/RN  ?Name of Charge Nurse/RN Notified De Hollingshead RN  ?Date Charge Nurse/RN Notified 03/28/22  ?Time Charge Nurse/RN Notified 1115  ?Notify: Provider  ?Provider Name/Title Lorella Nimrod  ?Date Provider Notified 03/28/22  ?Time Provider Notified 1115  ?Notification Type Call  ?Notification Reason Other (Comment) ?(BP increase)  ?Provider response See new orders  ?Date of Provider Response 03/28/22  ?Time of Provider Response 0115  ?Assess: SIRS CRITERIA  ?SIRS Temperature  0  ?SIRS Pulse 0  ?SIRS Respirations  0  ?SIRS WBC 0  ?SIRS Score Sum  0  ? ? ?

## 2022-03-30 LAB — VITAMIN B1: Vitamin B1 (Thiamine): 335.8 nmol/L — ABNORMAL HIGH (ref 66.5–200.0)

## 2022-04-03 ENCOUNTER — Inpatient Hospital Stay: Admission: RE | Admit: 2022-04-03 | Payer: PRIVATE HEALTH INSURANCE | Source: Ambulatory Visit

## 2022-04-03 LAB — MISC LABCORP TEST (SEND OUT): Labcorp test code: 70115

## 2022-04-03 LAB — 25-HYDROXY VITAMIN D LCMS D2+D3
25-Hydroxy, Vitamin D-2: <1 ng/mL
25-Hydroxy, Vitamin D-3: 98 ng/mL
25-Hydroxy, Vitamin D: 98 ng/mL

## 2022-04-04 ENCOUNTER — Encounter
Admission: RE | Admit: 2022-04-04 | Discharge: 2022-04-04 | Disposition: A | Discharge status: Home / Self Care | Payer: Medicare Other | Source: Ambulatory Visit | Attending: Neurosurgery | Admitting: Neurosurgery

## 2022-04-04 VITALS — BP 156/83 | HR 65 | Temp 97.9°F | Resp 18 | Ht 61.0 in | Wt 130.0 lb

## 2022-04-04 DIAGNOSIS — Z01812 Encounter for preprocedural laboratory examination: Secondary | ICD-10-CM | POA: Insufficient documentation

## 2022-04-04 HISTORY — DX: Cardiac murmur, unspecified: R01.1

## 2022-04-04 HISTORY — DX: Essential (primary) hypertension: I10

## 2022-04-04 LAB — URINALYSIS, ROUTINE W REFLEX MICROSCOPIC
Bilirubin Urine: NEGATIVE
Glucose, UA: NEGATIVE mg/dL
Hgb urine dipstick: NEGATIVE
Ketones, ur: NEGATIVE mg/dL
Leukocytes,Ua: NEGATIVE
Nitrite: NEGATIVE
Protein, ur: NEGATIVE mg/dL
Specific Gravity, Urine: 1.005 (ref 1.005–1.030)
pH: 7 (ref 5.0–8.0)

## 2022-04-04 LAB — SURGICAL PCR SCREEN
MRSA, PCR: NEGATIVE
Staphylococcus aureus: NEGATIVE

## 2022-04-04 LAB — TYPE AND SCREEN
ABO/RH(D): O POS
Antibody Screen: NEGATIVE
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
## 2022-04-04 NOTE — Patient Instructions (Addendum)
?Your procedure is scheduled on: Wednesday April 11, 2022. ?Report to Day Surgery inside [facility] 2nd floor, stop by admissions desk before getting on elevator.  ?To find out your arrival time please call [phone] between 1PM - 3PM on Tuesday April 10, 2022. ? ?Remember: Instructions that are not followed completely may result in serious medical risk,  ?up to and including death, or upon the discretion of your surgeon and anesthesiologist your  ?surgery may need to be rescheduled.  ? ?  _X__ 1. Do not eat food after midnight the night before your procedure. ?                No chewing gum or hard candies. You may drink clear liquids up to 2 hours ?                before you are scheduled to arrive for your surgery- DO not drink clear ?                liquids within 2 hours of the start of your surgery. ?                Clear Liquids include:  water, apple juice without pulp, clear Gatorade, G2 or  ?                Gatorade Zero (avoid Red/Purple/Blue), Black Coffee or Tea (Do not add ?                anything to coffee or tea). ? ?__X__2.  On the morning of surgery brush your teeth with toothpaste and water, you ?               may rinse your mouth with mouthwash if you wish.  Do not swallow any toothpaste or mouthwash. ?   ? _X__ 3.  No Alcohol for 24 hours before or after surgery. ? ? _X__ 4.  Do Not Smoke or use e-cigarettes For 24 Hours Prior to Your Surgery. ?                Do not use any chewable tobacco products for at least 6 hours prior to ?                Surgery. ? ?_X__  5.  Do not use any recreational drugs (marijuana, cocaine, heroin, ecstasy, MDMA or other) ?               For at least one week prior to your surgery.  Combination of these drugs with anesthesia ?               May have life threatening results. ? ?____  6.  Bring all medications with you on the day of surgery if instructed.  ? ?__X__  7.  Notify your doctor if there is any change in your medical  condition  ?    (cold, fever, infections). ?    ?Do not wear jewelry, make-up, hairpins, clips or nail polish. ?Do not wear lotions, powders, or perfumes. You may wear deodorant. ?Do not shave 48 hours prior to surgery. Men may shave face and neck. ?Do not bring valuables to the hospital.   ? ?[facility] is not responsible for any belongings or valuables. ? ?Contacts, dentures or bridgework may not be worn into surgery. ?Leave your suitcase in the car. After surgery it may be brought to your room. ?For patients admitted to the hospital, discharge time is  determined by your ?treatment team. ?  ?Patients discharged the day of surgery will not be allowed to drive home.   ?Make arrangements for someone to be with you for the first 24 hours of your ?Same Day Discharge. ? ? ?__X__ Take these medicines the morning of surgery with A SIP OF WATER:  ? ? 1. gabapentin (NEURONTIN) 800 MG  ? 2. oxyCODONE (OXY IR/ROXICODONE) 5 MG immediate release (if needed) ? 3.  ? 4. ? 5. ? 6. ? ?____ Fleet Enema (as directed)  ? ?__X__ Use CHG Soap (or wipes) as directed ? ?____ Use Benzoyl Peroxide Gel as instructed ? ?____ Use inhalers on the day of surgery ? ?____ Stop metformin 2 days prior to surgery   ? ?____ Take 1/2 of usual insulin dose the night before surgery. No insulin the morning ?         of surgery.  ? ?____ Call your PCP, cardiologist, or Pulmonologist if taking Coumadin/Plavix/aspirin and ask when to stop before your surgery.  ? ?__X__ One Week prior to surgery- Stop Anti-inflammatories such as Ibuprofen, Aleve, Advil, Motrin, meloxicam (MOBIC), diclofenac, etodolac, ketorolac, Toradol, Daypro, piroxicam, Goody's or BC powders. OK TO USE TYLENOL IF NEEDED ?  ?__X__ One week prior to surgery- stop ALL supplements until after surgery.   ? ?____ Bring C-Pap to the hospital.  ? ? ?If you have any questions regarding your pre-procedure instructions,  ?Please call Pre-admit Testing at [phone] ?

## 2022-04-11 ENCOUNTER — Other Ambulatory Visit: Payer: Self-pay

## 2022-04-11 ENCOUNTER — Inpatient Hospital Stay
Admission: RE | Admit: 2022-04-11 | Discharge: 2022-04-13 | DRG: 455 | Disposition: A | Discharge status: Home Health | Payer: Medicare Other | Attending: Neurosurgery | Admitting: Neurosurgery

## 2022-04-11 ENCOUNTER — Inpatient Hospital Stay: Payer: Medicare Other | Admitting: Certified Registered"

## 2022-04-11 ENCOUNTER — Inpatient Hospital Stay: Payer: Medicare Other

## 2022-04-11 ENCOUNTER — Encounter
Admission: RE | Disposition: A | Discharge status: Home Health | Payer: Self-pay | Source: Home / Self Care | Attending: Neurosurgery

## 2022-04-11 ENCOUNTER — Encounter: Payer: Self-pay | Admitting: Neurosurgery

## 2022-04-11 DIAGNOSIS — Z818 Family history of other mental and behavioral disorders: Secondary | ICD-10-CM | POA: Diagnosis not present

## 2022-04-11 DIAGNOSIS — Z87891 Personal history of nicotine dependence: Secondary | ICD-10-CM

## 2022-04-11 DIAGNOSIS — M4316 Spondylolisthesis, lumbar region: Secondary | ICD-10-CM | POA: Diagnosis present

## 2022-04-11 DIAGNOSIS — Z79899 Other long term (current) drug therapy: Secondary | ICD-10-CM | POA: Diagnosis not present

## 2022-04-11 DIAGNOSIS — Z981 Arthrodesis status: Principal | ICD-10-CM

## 2022-04-11 DIAGNOSIS — Z86010 Personal history of colonic polyps: Secondary | ICD-10-CM | POA: Diagnosis not present

## 2022-04-11 DIAGNOSIS — M48062 Spinal stenosis, lumbar region with neurogenic claudication: Secondary | ICD-10-CM | POA: Diagnosis present

## 2022-04-11 DIAGNOSIS — Z8614 Personal history of Methicillin resistant Staphylococcus aureus infection: Secondary | ICD-10-CM

## 2022-04-11 DIAGNOSIS — Z808 Family history of malignant neoplasm of other organs or systems: Secondary | ICD-10-CM

## 2022-04-11 DIAGNOSIS — Z888 Allergy status to other drugs, medicaments and biological substances status: Secondary | ICD-10-CM

## 2022-04-11 DIAGNOSIS — Z8262 Family history of osteoporosis: Secondary | ICD-10-CM | POA: Diagnosis not present

## 2022-04-11 DIAGNOSIS — Z9081 Acquired absence of spleen: Secondary | ICD-10-CM

## 2022-04-11 DIAGNOSIS — Z825 Family history of asthma and other chronic lower respiratory diseases: Secondary | ICD-10-CM | POA: Diagnosis not present

## 2022-04-11 DIAGNOSIS — Z791 Long term (current) use of non-steroidal anti-inflammatories (NSAID): Secondary | ICD-10-CM

## 2022-04-11 DIAGNOSIS — M810 Age-related osteoporosis without current pathological fracture: Secondary | ICD-10-CM | POA: Diagnosis present

## 2022-04-11 DIAGNOSIS — Z862 Personal history of diseases of the blood and blood-forming organs and certain disorders involving the immune mechanism: Secondary | ICD-10-CM

## 2022-04-11 DIAGNOSIS — Z8616 Personal history of COVID-19: Secondary | ICD-10-CM | POA: Diagnosis not present

## 2022-04-11 DIAGNOSIS — Z8371 Family history of colonic polyps: Secondary | ICD-10-CM | POA: Diagnosis not present

## 2022-04-11 DIAGNOSIS — Z833 Family history of diabetes mellitus: Secondary | ICD-10-CM | POA: Diagnosis not present

## 2022-04-11 DIAGNOSIS — M47819 Spondylosis without myelopathy or radiculopathy, site unspecified: Secondary | ICD-10-CM | POA: Diagnosis present

## 2022-04-11 DIAGNOSIS — E785 Hyperlipidemia, unspecified: Secondary | ICD-10-CM | POA: Diagnosis present

## 2022-04-11 HISTORY — PX: TRANSFORAMINAL LUMBAR INTERBODY FUSION W/ MIS 1 LEVEL: SHX6145

## 2022-04-11 HISTORY — PX: APPLICATION OF INTRAOPERATIVE CT SCAN: SHX6668

## 2022-04-11 LAB — ABO/RH: ABO/RH(D): O POS

## 2022-04-11 SURGERY — MINIMALLY INVASIVE (MIS) TRANSFORAMINAL LUMBAR INTERBODY FUSION (TLIF) 1 LEVEL
Anesthesia: General

## 2022-04-11 MED ORDER — VANCOMYCIN HCL IN DEXTROSE 1-5 GM/200ML-% IV SOLN
INTRAVENOUS | Status: AC
  Filled 2022-04-11: qty 200

## 2022-04-11 MED ORDER — SURGIFLO WITH THROMBIN (HEMOSTATIC MATRIX KIT) OPTIME
TOPICAL | Status: DC | PRN
  Administered 2022-04-11: 1 via TOPICAL

## 2022-04-11 MED ORDER — DOCUSATE SODIUM 100 MG PO CAPS
100.0000 mg | ORAL_CAPSULE | Freq: Two times a day (BID) | ORAL | Status: DC
  Administered 2022-04-11 – 2022-04-13 (×4): 100 mg via ORAL
  Filled 2022-04-11 (×4): qty 1

## 2022-04-11 MED ORDER — ACETAMINOPHEN 500 MG PO TABS
1000.0000 mg | ORAL_TABLET | Freq: Four times a day (QID) | ORAL | Status: DC
  Administered 2022-04-12 – 2022-04-13 (×6): 1000 mg via ORAL
  Filled 2022-04-11 (×8): qty 2

## 2022-04-11 MED ORDER — SODIUM CHLORIDE 0.9 % IV SOLN: INTRAVENOUS | Status: DC

## 2022-04-11 MED ORDER — ONDANSETRON HCL 4 MG/2ML IJ SOLN
INTRAMUSCULAR | Status: AC
Start: 2022-04-11 — End: ?
  Filled 2022-04-11: qty 2

## 2022-04-11 MED ORDER — VANCOMYCIN HCL IN DEXTROSE 1-5 GM/200ML-% IV SOLN: 1000.0000 mg | INTRAVENOUS | Status: AC

## 2022-04-11 MED ORDER — FLEET ENEMA 7-19 GM/118ML RE ENEM: 1.0000 | ENEMA | Freq: Once | RECTAL | Status: DC | PRN

## 2022-04-11 MED ORDER — ORAL CARE MOUTH RINSE: 15.0000 mL | Freq: Once | OROMUCOSAL | Status: AC

## 2022-04-11 MED ORDER — PHENYLEPHRINE HCL (PRESSORS) 10 MG/ML IV SOLN
INTRAVENOUS | Status: DC | PRN
  Administered 2022-04-11 (×4): 80 ug via INTRAVENOUS

## 2022-04-11 MED ORDER — PROPOFOL 10 MG/ML IV BOLUS
INTRAVENOUS | Status: DC | PRN
  Administered 2022-04-11: 150 mg via INTRAVENOUS

## 2022-04-11 MED ORDER — MORPHINE SULFATE (PF) 2 MG/ML IV SOLN
2.0000 mg | INTRAVENOUS | Status: DC | PRN
  Administered 2022-04-11 – 2022-04-13 (×2): 2 mg via INTRAVENOUS
  Filled 2022-04-11 (×2): qty 1

## 2022-04-11 MED ORDER — PHENOL 1.4 % MT LIQD: 1.0000 | OROMUCOSAL | Status: DC | PRN

## 2022-04-11 MED ORDER — HYDROMORPHONE HCL 1 MG/ML IJ SOLN
0.5000 mg | INTRAMUSCULAR | Status: AC
  Administered 2022-04-11: 0.5 mg via INTRAVENOUS

## 2022-04-11 MED ORDER — ACETAMINOPHEN 10 MG/ML IV SOLN
INTRAVENOUS | Status: DC | PRN
  Administered 2022-04-11: 1000 mg via INTRAVENOUS

## 2022-04-11 MED ORDER — MIDAZOLAM HCL 2 MG/2ML IJ SOLN
INTRAMUSCULAR | Status: DC | PRN
Start: 2022-04-11 — End: 2022-04-11
  Administered 2022-04-11: 1 mg via INTRAVENOUS

## 2022-04-11 MED ORDER — HEPARIN SODIUM (PORCINE) 1000 UNIT/ML IJ SOLN
INTRAMUSCULAR | Status: DC | PRN
  Administered 2022-04-11: 10 mL

## 2022-04-11 MED ORDER — OXYCODONE HCL 5 MG PO TABS
10.0000 mg | ORAL_TABLET | ORAL | Status: DC | PRN
  Administered 2022-04-13 (×2): 10 mg via ORAL
  Filled 2022-04-11 (×2): qty 2

## 2022-04-11 MED ORDER — BUPIVACAINE-EPINEPHRINE (PF) 0.5% -1:200000 IJ SOLN
INTRAMUSCULAR | Status: AC
  Filled 2022-04-11: qty 30

## 2022-04-11 MED ORDER — ACETAMINOPHEN 325 MG PO TABS: 650.0000 mg | ORAL_TABLET | ORAL | Status: DC | PRN

## 2022-04-11 MED ORDER — SURGIPHOR WOUND IRRIGATION SYSTEM - OPTIME
TOPICAL | Status: DC | PRN
  Administered 2022-04-11: 300 mL via TOPICAL

## 2022-04-11 MED ORDER — SODIUM CHLORIDE 0.9% FLUSH: 3.0000 mL | INTRAVENOUS | Status: DC | PRN

## 2022-04-11 MED ORDER — SODIUM CHLORIDE 0.9% FLUSH
3.0000 mL | Freq: Two times a day (BID) | INTRAVENOUS | Status: DC
  Administered 2022-04-11 – 2022-04-12 (×2): 3 mL via INTRAVENOUS

## 2022-04-11 MED ORDER — SENNA 8.6 MG PO TABS
1.0000 | ORAL_TABLET | Freq: Two times a day (BID) | ORAL | Status: DC
  Administered 2022-04-11 – 2022-04-13 (×4): 8.6 mg via ORAL
  Filled 2022-04-11 (×4): qty 1

## 2022-04-11 MED ORDER — PHENYLEPHRINE HCL-NACL 20-0.9 MG/250ML-% IV SOLN
INTRAVENOUS | Status: DC | PRN
  Administered 2022-04-11: 45 ug/min via INTRAVENOUS

## 2022-04-11 MED ORDER — LOSARTAN POTASSIUM 50 MG PO TABS
50.0000 mg | ORAL_TABLET | Freq: Every day | ORAL | Status: DC
  Administered 2022-04-12 – 2022-04-13 (×2): 50 mg via ORAL
  Filled 2022-04-11 (×2): qty 1

## 2022-04-11 MED ORDER — HEPARIN SODIUM (PORCINE) 1000 UNIT/ML IJ SOLN
INTRAMUSCULAR | Status: AC
  Filled 2022-04-11: qty 10

## 2022-04-11 MED ORDER — FENTANYL CITRATE (PF) 100 MCG/2ML IJ SOLN
INTRAMUSCULAR | Status: AC
  Administered 2022-04-11: 25 ug via INTRAVENOUS
  Filled 2022-04-11: qty 2

## 2022-04-11 MED ORDER — HYDROMORPHONE HCL 1 MG/ML IJ SOLN
INTRAMUSCULAR | Status: DC | PRN
Start: 2022-04-11 — End: 2022-04-11
  Administered 2022-04-11: .25 mg via INTRAVENOUS

## 2022-04-11 MED ORDER — PRAVASTATIN SODIUM 20 MG PO TABS
40.0000 mg | ORAL_TABLET | Freq: Every day | ORAL | Status: DC
  Administered 2022-04-11 – 2022-04-12 (×2): 40 mg via ORAL
  Filled 2022-04-11 (×2): qty 2

## 2022-04-11 MED ORDER — KETOROLAC TROMETHAMINE 15 MG/ML IJ SOLN
INTRAMUSCULAR | Status: AC
  Administered 2022-04-12: 7.5 mg via INTRAVENOUS
  Filled 2022-04-11: qty 1

## 2022-04-11 MED ORDER — SODIUM CHLORIDE (PF) 0.9 % IJ SOLN
INTRAMUSCULAR | Status: DC | PRN
  Administered 2022-04-11: 60 mL

## 2022-04-11 MED ORDER — ONDANSETRON HCL 4 MG/2ML IJ SOLN: 4.0000 mg | Freq: Once | INTRAMUSCULAR | Status: DC | PRN

## 2022-04-11 MED ORDER — LABETALOL HCL 5 MG/ML IV SOLN
INTRAVENOUS | Status: AC
Start: 2022-04-11 — End: 2022-04-12
  Filled 2022-04-11: qty 4

## 2022-04-11 MED ORDER — SUCCINYLCHOLINE CHLORIDE 200 MG/10ML IV SOSY
PREFILLED_SYRINGE | INTRAVENOUS | Status: DC | PRN
  Administered 2022-04-11: 100 mg via INTRAVENOUS

## 2022-04-11 MED ORDER — LIDOCAINE HCL (PF) 2 % IJ SOLN
INTRAMUSCULAR | Status: AC
  Filled 2022-04-11: qty 5

## 2022-04-11 MED ORDER — FAMOTIDINE 20 MG PO TABS: 20.0000 mg | ORAL_TABLET | Freq: Once | ORAL | Status: DC

## 2022-04-11 MED ORDER — LACTATED RINGERS IV SOLN: INTRAVENOUS | Status: DC

## 2022-04-11 MED ORDER — PROPOFOL 1000 MG/100ML IV EMUL
INTRAVENOUS | Status: AC
  Filled 2022-04-11: qty 100

## 2022-04-11 MED ORDER — METHOCARBAMOL 1000 MG/10ML IJ SOLN
500.0000 mg | Freq: Four times a day (QID) | INTRAVENOUS | Status: DC
  Filled 2022-04-11: qty 5

## 2022-04-11 MED ORDER — ONDANSETRON HCL 4 MG/2ML IJ SOLN
INTRAMUSCULAR | Status: DC | PRN
  Administered 2022-04-11 (×2): 4 mg via INTRAVENOUS

## 2022-04-11 MED ORDER — POLYETHYLENE GLYCOL 3350 17 G PO PACK: 17.0000 g | PACK | Freq: Every day | ORAL | Status: DC | PRN

## 2022-04-11 MED ORDER — MENTHOL 3 MG MT LOZG: 1.0000 | LOZENGE | OROMUCOSAL | Status: DC | PRN

## 2022-04-11 MED ORDER — SODIUM CHLORIDE FLUSH 0.9 % IV SOLN
INTRAVENOUS | Status: AC
  Administered 2022-04-12: 3 mL via INTRAVENOUS
  Filled 2022-04-11: qty 10

## 2022-04-11 MED ORDER — MIDAZOLAM HCL 2 MG/2ML IJ SOLN
INTRAMUSCULAR | Status: AC
  Filled 2022-04-11: qty 2

## 2022-04-11 MED ORDER — KETOROLAC TROMETHAMINE 15 MG/ML IJ SOLN
7.5000 mg | Freq: Four times a day (QID) | INTRAMUSCULAR | Status: AC
  Administered 2022-04-11 – 2022-04-12 (×3): 7.5 mg via INTRAVENOUS
  Filled 2022-04-11 (×3): qty 1

## 2022-04-11 MED ORDER — VITAMIN B-12 1000 MCG PO TABS
1000.0000 ug | ORAL_TABLET | Freq: Every day | ORAL | Status: DC
  Administered 2022-04-12 – 2022-04-13 (×2): 1000 ug via ORAL
  Filled 2022-04-11 (×2): qty 1

## 2022-04-11 MED ORDER — REMIFENTANIL HCL 1 MG IV SOLR
INTRAVENOUS | Status: DC | PRN
  Administered 2022-04-11: .2 ug/kg/min via INTRAVENOUS

## 2022-04-11 MED ORDER — LACTATED RINGERS IV SOLN: INTRAVENOUS | Status: DC | PRN

## 2022-04-11 MED ORDER — GABAPENTIN 400 MG PO CAPS
800.0000 mg | ORAL_CAPSULE | Freq: Three times a day (TID) | ORAL | Status: DC
  Administered 2022-04-11 – 2022-04-13 (×5): 800 mg via ORAL
  Filled 2022-04-11 (×5): qty 2

## 2022-04-11 MED ORDER — DEXAMETHASONE SODIUM PHOSPHATE 10 MG/ML IJ SOLN
INTRAMUSCULAR | Status: DC | PRN
  Administered 2022-04-11: 10 mg via INTRAVENOUS

## 2022-04-11 MED ORDER — METHOCARBAMOL 500 MG PO TABS
500.0000 mg | ORAL_TABLET | Freq: Four times a day (QID) | ORAL | Status: DC
  Administered 2022-04-11 – 2022-04-13 (×8): 500 mg via ORAL
  Filled 2022-04-11 (×8): qty 1

## 2022-04-11 MED ORDER — HYDROMORPHONE HCL 1 MG/ML IJ SOLN
INTRAMUSCULAR | Status: AC
  Filled 2022-04-11: qty 1

## 2022-04-11 MED ORDER — SODIUM CHLORIDE 0.9 % IV SOLN: 250.0000 mL | INTRAVENOUS | Status: DC

## 2022-04-11 MED ORDER — CHLORHEXIDINE GLUCONATE 0.12 % MT SOLN: 15.0000 mL | Freq: Once | OROMUCOSAL | Status: AC

## 2022-04-11 MED ORDER — BUPIVACAINE-EPINEPHRINE (PF) 0.5% -1:200000 IJ SOLN
INTRAMUSCULAR | Status: DC | PRN
  Administered 2022-04-11: 10 mL

## 2022-04-11 MED ORDER — HYDROMORPHONE HCL 1 MG/ML IJ SOLN
INTRAMUSCULAR | Status: AC
Start: 2022-04-11 — End: 2022-04-12
  Filled 2022-04-11: qty 1

## 2022-04-11 MED ORDER — ENOXAPARIN SODIUM 40 MG/0.4ML IJ SOSY
40.0000 mg | PREFILLED_SYRINGE | INTRAMUSCULAR | Status: DC
  Administered 2022-04-12 – 2022-04-13 (×2): 40 mg via SUBCUTANEOUS
  Filled 2022-04-11 (×2): qty 0.4

## 2022-04-11 MED ORDER — ACETAMINOPHEN 650 MG RE SUPP: 650.0000 mg | RECTAL | Status: DC | PRN

## 2022-04-11 MED ORDER — FENTANYL CITRATE (PF) 100 MCG/2ML IJ SOLN
INTRAMUSCULAR | Status: AC
  Filled 2022-04-11: qty 2

## 2022-04-11 MED ORDER — ONDANSETRON HCL 4 MG PO TABS: 4.0000 mg | ORAL_TABLET | Freq: Four times a day (QID) | ORAL | Status: DC | PRN

## 2022-04-11 MED ORDER — CEFAZOLIN SODIUM-DEXTROSE 2-4 GM/100ML-% IV SOLN
2.0000 g | INTRAVENOUS | Status: AC
  Administered 2022-04-11: 2 g via INTRAVENOUS

## 2022-04-11 MED ORDER — ONDANSETRON HCL 4 MG/2ML IJ SOLN: 4.0000 mg | Freq: Four times a day (QID) | INTRAMUSCULAR | Status: DC | PRN

## 2022-04-11 MED ORDER — PROPOFOL 10 MG/ML IV BOLUS
INTRAVENOUS | Status: AC
  Filled 2022-04-11: qty 20

## 2022-04-11 MED ORDER — REMIFENTANIL HCL 1 MG IV SOLR
INTRAVENOUS | Status: AC
  Filled 2022-04-11: qty 1000

## 2022-04-11 MED ORDER — BISACODYL 10 MG RE SUPP
10.0000 mg | Freq: Every day | RECTAL | Status: DC | PRN
Start: 2022-04-11 — End: 2022-04-13

## 2022-04-11 MED ORDER — FIBRIN SEALANT 2 ML SINGLE DOSE KIT
PACK | CUTANEOUS | Status: DC | PRN
  Administered 2022-04-11: 2 mL via TOPICAL

## 2022-04-11 MED ORDER — CEFAZOLIN SODIUM-DEXTROSE 2-4 GM/100ML-% IV SOLN
INTRAVENOUS | Status: AC
Start: 2022-04-11 — End: 2022-04-11
  Filled 2022-04-11: qty 100

## 2022-04-11 MED ORDER — PROPOFOL 500 MG/50ML IV EMUL
INTRAVENOUS | Status: DC | PRN
Start: 2022-04-11 — End: 2022-04-11
  Administered 2022-04-11: 165 ug/kg/min via INTRAVENOUS

## 2022-04-11 MED ORDER — VANCOMYCIN HCL IN DEXTROSE 1-5 GM/200ML-% IV SOLN
INTRAVENOUS | Status: AC
  Administered 2022-04-11: 1000 mg via INTRAVENOUS
  Filled 2022-04-11: qty 200

## 2022-04-11 MED ORDER — DIPHENHYDRAMINE HCL 50 MG/ML IJ SOLN
INTRAMUSCULAR | Status: DC | PRN
  Administered 2022-04-11: 25 mg via INTRAVENOUS

## 2022-04-11 MED ORDER — FENTANYL CITRATE (PF) 100 MCG/2ML IJ SOLN
INTRAMUSCULAR | Status: DC | PRN
Start: 2022-04-11 — End: 2022-04-11
  Administered 2022-04-11: 50 ug via INTRAVENOUS

## 2022-04-11 MED ORDER — ACETAMINOPHEN 10 MG/ML IV SOLN
INTRAVENOUS | Status: AC
  Filled 2022-04-11: qty 100

## 2022-04-11 MED ORDER — CHLORHEXIDINE GLUCONATE 0.12 % MT SOLN
OROMUCOSAL | Status: AC
  Administered 2022-04-11: 15 mL via OROMUCOSAL
  Filled 2022-04-11: qty 15

## 2022-04-11 MED ORDER — OXYCODONE HCL 5 MG PO TABS
5.0000 mg | ORAL_TABLET | ORAL | Status: DC | PRN
  Administered 2022-04-11 – 2022-04-12 (×2): 5 mg via ORAL
  Filled 2022-04-11: qty 1

## 2022-04-11 MED ORDER — FENTANYL CITRATE (PF) 100 MCG/2ML IJ SOLN
25.0000 ug | INTRAMUSCULAR | Status: DC | PRN
  Administered 2022-04-11 (×3): 25 ug via INTRAVENOUS
  Administered 2022-04-11: 50 ug via INTRAVENOUS

## 2022-04-11 MED ORDER — LIDOCAINE HCL (CARDIAC) PF 100 MG/5ML IV SOSY
PREFILLED_SYRINGE | INTRAVENOUS | Status: DC | PRN
Start: 2022-04-11 — End: 2022-04-11
  Administered 2022-04-11: 60 mg via INTRAVENOUS

## 2022-04-11 MED ORDER — 0.9 % SODIUM CHLORIDE (POUR BTL) OPTIME
TOPICAL | Status: DC | PRN
  Administered 2022-04-11: 1000 mL

## 2022-04-11 MED ORDER — OXYCODONE HCL 5 MG PO TABS
ORAL_TABLET | ORAL | Status: AC
  Filled 2022-04-11: qty 1

## 2022-04-11 MED ORDER — DEXAMETHASONE SODIUM PHOSPHATE 10 MG/ML IJ SOLN
INTRAMUSCULAR | Status: AC
  Filled 2022-04-11: qty 1

## 2022-04-11 MED ORDER — ROCURONIUM BROMIDE 10 MG/ML (PF) SYRINGE
PREFILLED_SYRINGE | INTRAVENOUS | Status: AC
  Filled 2022-04-11: qty 10

## 2022-04-11 SURGICAL SUPPLY — 87 items
ALLOGRAFT BONE FIBER KORE 5 (Bone Implant) ×1 IMPLANT
BUR NEURO DRILL SOFT 3.0X3.8M (BURR) ×2 IMPLANT
CAGE SABLE 10X22 7-13 15D (Cage) ×1 IMPLANT
CAP LOCKING SPINE (Cap) ×4 IMPLANT
CHLORAPREP W/TINT 26 (MISCELLANEOUS) ×4 IMPLANT
CNTNR SPEC 2.5X3XGRAD LEK (MISCELLANEOUS) ×1
CONT SPEC 4OZ STER OR WHT (MISCELLANEOUS) ×1
CONTAINER SPEC 2.5X3XGRAD LEK (MISCELLANEOUS) ×1 IMPLANT
COUNTER NEEDLE 20/40 LG (NEEDLE) ×2 IMPLANT
CUP MEDICINE 2OZ PLAST GRAD ST (MISCELLANEOUS) ×4 IMPLANT
DERMABOND ADVANCED (GAUZE/BANDAGES/DRESSINGS) ×1
DERMABOND ADVANCED .7 DNX12 (GAUZE/BANDAGES/DRESSINGS) ×1 IMPLANT
DRAPE 3D C-ARM OEC (DRAPES) IMPLANT
DRAPE C ARM PK CFD 31 SPINE (DRAPES) IMPLANT
DRAPE C-ARMOR (DRAPES) IMPLANT
DRAPE INCISE IOBAN 66X45 STRL (DRAPES) ×2 IMPLANT
DRAPE LAPAROTOMY 100X77 ABD (DRAPES) ×2 IMPLANT
DRAPE MICROSCOPE SPINE 48X150 (DRAPES) IMPLANT
DRAPE SCAN PATIENT (DRAPES) ×2 IMPLANT
DRAPE SURG 17X11 SM STRL (DRAPES) ×4 IMPLANT
DRSG OPSITE POSTOP 4X6 (GAUZE/BANDAGES/DRESSINGS) ×2 IMPLANT
DRSG OPSITE POSTOP 4X8 (GAUZE/BANDAGES/DRESSINGS) IMPLANT
DRSG TEGADERM 4X4.75 (GAUZE/BANDAGES/DRESSINGS) IMPLANT
ELECT CAUTERY BLADE TIP 2.5 (TIP) ×2
ELECT EZSTD 165MM 6.5IN (MISCELLANEOUS)
ELECT REM PT RETURN 9FT ADLT (ELECTROSURGICAL) ×2
ELECTRODE CAUTERY BLDE TIP 2.5 (TIP) ×1 IMPLANT
ELECTRODE EZSTD 165MM 6.5IN (MISCELLANEOUS) IMPLANT
ELECTRODE REM PT RTRN 9FT ADLT (ELECTROSURGICAL) ×1 IMPLANT
EX-PIN ORTHOLOCK NAV 4X150 (PIN) ×1 IMPLANT
FEE INTRAOP CADWELL SUPPLY NCS (MISCELLANEOUS) IMPLANT
FEE INTRAOP MONITOR IMPULS NCS (MISCELLANEOUS) IMPLANT
GAUZE 4X4 16PLY ~~LOC~~+RFID DBL (SPONGE) ×2 IMPLANT
GLOVE BIOGEL PI IND STRL 6.5 (GLOVE) ×2 IMPLANT
GLOVE BIOGEL PI INDICATOR 6.5 (GLOVE) ×2
GLOVE SURG SYN 6.5 ES PF (GLOVE) ×4 IMPLANT
GLOVE SURG SYN 6.5 PF PI (GLOVE) ×2 IMPLANT
GLOVE SURG SYN 8.5  E (GLOVE) ×4
GLOVE SURG SYN 8.5 E (GLOVE) ×4 IMPLANT
GLOVE SURG SYN 8.5 PF PI (GLOVE) ×4 IMPLANT
GOWN SRG LRG LVL 4 IMPRV REINF (GOWNS) ×3 IMPLANT
GOWN SRG XL LVL 3 NONREINFORCE (GOWNS) ×1 IMPLANT
GOWN STRL NON-REIN TWL XL LVL3 (GOWNS) ×1
GOWN STRL REIN LRG LVL4 (GOWNS) ×3
GRADUATE 1200CC STRL 31836 (MISCELLANEOUS) ×2 IMPLANT
GRAFT DURAGEN MATRIX 1WX1L (Tissue) ×1 IMPLANT
HEMOVAC 400CC 10FR (MISCELLANEOUS) IMPLANT
INTRAOP CADWELL SUPPLY FEE NCS (MISCELLANEOUS)
INTRAOP DISP SUPPLY FEE NCS (MISCELLANEOUS)
INTRAOP MONITOR FEE IMPULS NCS (MISCELLANEOUS)
INTRAOP MONITOR FEE IMPULSE (MISCELLANEOUS)
K-WIRE 1.6 NITINOL SHARP TIP (WIRE) ×8
KIT ASPI BONE MRW 60ML (KITS) ×1 IMPLANT
KIT PREVENA INCISION MGT 13 (CANNISTER) IMPLANT
KIT SPINAL PRONEVIEW (KITS) ×2 IMPLANT
KNIFE BAYONET SHORT DISCETOMY (MISCELLANEOUS) IMPLANT
KWIRE 1.6 NITINOL SHARP TIP (WIRE) IMPLANT
MANIFOLD NEPTUNE II (INSTRUMENTS) ×2 IMPLANT
MARKER SKIN DUAL TIP RULER LAB (MISCELLANEOUS) ×3 IMPLANT
MARKER SPHERE PSV REFLC 13MM (MARKER) ×15 IMPLANT
NDL SAFETY ECLIPSE 18X1.5 (NEEDLE) ×1 IMPLANT
NEEDLE HYPO 18GX1.5 SHARP (NEEDLE) ×1
NEEDLE HYPO 22GX1.5 SAFETY (NEEDLE) ×2 IMPLANT
NS IRRIG 1000ML POUR BTL (IV SOLUTION) ×2 IMPLANT
PACK LAMINECTOMY NEURO (CUSTOM PROCEDURE TRAY) ×2 IMPLANT
ROD SPINE CURVE 5.5X55 (Rod) ×2 IMPLANT
SCREW CREO 6.5X45 FEN (Screw) ×4 IMPLANT
SCREW CREO MIS 30 TULIP (Screw) ×4 IMPLANT
SOLUTION IRRIG SURGIPHOR (IV SOLUTION) ×2 IMPLANT
SPONGE GAUZE 2X2 8PLY STRL LF (GAUZE/BANDAGES/DRESSINGS) ×2 IMPLANT
STAPLER SKIN PROX 35W (STAPLE) IMPLANT
SURGIFLO W/THROMBIN 8M KIT (HEMOSTASIS) ×2 IMPLANT
SUT DVC VLOC 3-0 CL 6 P-12 (SUTURE) ×2 IMPLANT
SUT ETHILON 3-0 FS-10 30 BLK (SUTURE) ×2
SUT V-LOC 90 ABS DVC 3-0 CL (SUTURE) IMPLANT
SUT VIC AB 0 CT1 27 (SUTURE) ×2
SUT VIC AB 0 CT1 27XCR 8 STRN (SUTURE) ×1 IMPLANT
SUT VIC AB 2-0 CT1 18 (SUTURE) ×3 IMPLANT
SUTURE EHLN 3-0 FS-10 30 BLK (SUTURE) IMPLANT
SYR 20ML LL LF (SYRINGE) ×2 IMPLANT
SYR 30ML LL (SYRINGE) ×4 IMPLANT
TOWEL OR 17X26 4PK STRL BLUE (TOWEL DISPOSABLE) ×6 IMPLANT
TRAY FOLEY SLVR 16FR LF STAT (SET/KITS/TRAYS/PACK) IMPLANT
TROCAR INSERT W/PEDICLE NDL (TROCAR) IMPLANT
TROCAR INSERT W/PEDICLE NDLE (TROCAR)
TUBING CONNECTING 10 (TUBING) ×2 IMPLANT
WATER STERILE IRR 500ML POUR (IV SOLUTION) IMPLANT
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
## 2022-04-11 NOTE — Anesthesia Procedure Notes (Signed)
Procedure Name: Intubation ?Date/Time: 04/11/2022 3:29 PM ?Performed by: Loletha Grayer, CRNA ?Pre-anesthesia Checklist: Patient identified, Patient being monitored, Timeout performed, Emergency Drugs available and Suction available ?Patient Re-evaluated:Patient Re-evaluated prior to induction ?Oxygen Delivery Method: Circle system utilized ?Preoxygenation: Pre-oxygenation with 100% oxygen ?Induction Type: IV induction ?Ventilation: Mask ventilation without difficulty ?Laryngoscope Size: Mac and 3 ?Grade View: Grade III ?Tube type: Oral ?Tube size: 7.0 mm ?Number of attempts: 1 ?Airway Equipment and Method: Stylet ?Placement Confirmation: ETT inserted through vocal cords under direct vision, positive ETCO2 and breath sounds checked- equal and bilateral ?Secured at: 21 cm ?Tube secured with: Tape ?Dental Injury: Teeth and Oropharynx as per pre-operative assessment  ? ? ? ? ?
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
## 2022-04-11 NOTE — Op Note (Signed)
Indications: Connie Doyle is a 76 yo female who presented with: ?Anterolisthesis M43.10, Neurogenic claudication due to lumbar spinal stenosis M48.062 ? ?She failed conservative management prompting surgical intervention. ? ?Findings: correction of anterolisthesis ? ?Preoperative Diagnosis: Lumbar Stenosis with neurogenic claudication ?Postoperative Diagnosis: same ? ? ?EBL: 100 ml ?IVF: see AR ml ?Drains: none ?Disposition: Extubated and Stable to PACU ?Complications: none ? ?No foley catheter was placed. ? ? ?Preoperative Note:  ? ?Risks of surgery discussed include: infection, bleeding, stroke, coma, death, paralysis, CSF leak, nerve/spinal cord injury, numbness, tingling, weakness, complex regional pain syndrome, recurrent stenosis and/or disc herniation, vascular injury, development of instability, neck/back pain, need for further surgery, persistent symptoms, development of deformity, and the risks of anesthesia. The patient understood these risks and agreed to proceed. ? ?Operative Note: ? ?1. Transforaminal Lumbar Interbody Fusion L4/5 ?2. Posterolateral arthrodesis L4 to L5 ?3. Posterior nonsegmental instrumentation L4 to L5 using Globus Creo ?4. Lumbar decompression including central decompression, bilateral medial facetectomies, and bilateral foraminotomies at L4/5 ?5. Harvesting of autograft via the same incision ?6. Placement of a biomechanical device ([place]) at L4/5 for anterior arthrodesis ? ?The patient was brought to the Operating Room, intubated and turned into the prone position. All pressure points were checked and double checked. The patient was prepped and draped in the standard fashion. A full timeout was performed. Preoperative antibiotics were given.  ? ?The stereotactic array was placed into the right iliac crest.  A stereotactic CT was obtained.  This was registered to the patient.  Using the image guidance, the incisions were marked on both sides. ? ?The incisions were injected with  local anesthetic. ? ?In a separate incision, the right iliac crest was cannulated and 60 mL of bone marrow was aspirated.  This was handed off for preparation and use as autograft later in the procedure. ? ? ?Each incision was opened with a scalpel, bovie electrocautery was used to open the fascia. Using stereotactic guidance, the pedicles at L4 and L5 were cannulated bilaterally.  K wires were placed and secured.  Using image guidance, 6.5 x 45 mm screws were placed on the right side at L4 and L5.  6.5 x 45 mm screw was placed and L4 on the left.  The K wire was left at L5 until after the transforaminal lumbar interbody fusion was performed. ? ?The image guidance system was used to choose a second fascial opening on the left side for the transforaminal lumbar interbody fusion.  The fascia was opened.  Metrix tubes were then sequentially placed over the left L4-5 facet joint.  This was confirmed with image guidance.  The drill was then used to remove the left inferior articulating process of L4 as well as the superior half of the right L5 superior articulating process. ? ?The ligamentum flavum was identified.  This was then removed.  The left L5 nerve root was identified.  The L4-5 disc was identified.  The nerve root was protected, then the disc space was opened. ? ?After incising the disc space, we took a combination of pituitary rongeurs, Kerrison rongeurs, disc scrapers, and curettes to remove a majority of the disc material.  We prepared the end plates for accepting the interbody fusion.  We removed the cartilaginous plate, preserved the cortical endplate if possible during this procedure.  The disc space was irrigated. ? ?The globus Sable  TLIF biomechanical device was inserted, then backfilled with a mixture of allograft and autograft. During placement, the nerve roots and  dural sac were carefully protected without any leaks identified. After placement of the device, the tubular retractor was removed.  The  left L5 pedicle screw was then placed and the rods were placed.  The rod was secured using locking caps to manufacturer specifications.  The L5 screws were locked down first and then the L4 so that the anterolisthesis would be reduced.  We then obtained a confirmatory CT scan to confirm adequate placement of implants.   ? ?Posterolateral arthrodesis was performed on the right at L4-5. ? ?The Metrix tube system was then replaced in the position where the transforaminal lumbar interbody fusion was performed.  The Kerrison rongeurs were used to remove additional ligamentum flavum until the right side of the thecal sac was also decompressed.  The L4 and L5 nerve roots on the left were palpated and felt to be adequately decompressed.  The right L5 nerve root was also adequately decompressed. ? ?A small dural defect was noted on the left L5 nerve root on the nerve root sleeve.  There was minimal spinal fluid flow.  A small piece of DuraGen was placed on the nerve root and then some Tisseel was used to seal off the spinal fluid leakage. ? ?The tubular system was then removed and bipolar electrocautery used for hemostasis.  We then turned our attention to closure. ? ?The incisions were closed in layers using 0 and 2-0 Vicryl.  The right [place] incision was closed with a 3-0 Monocryl V-Loc.  The left incision was closed with a 3-0 nylon on the skin.  Sterile dressings were applied. ? ? ?The patient was then flipped supine and extubated with incident. All counts were correct times 2 at the end of the case. No immediate complications were noted. ? ?Cooper Render PA assisted in the entire procedure. ? ?Meade Maw MD ? ? ? ? ? ? ?

## 2022-04-11 NOTE — Progress Notes (Signed)
Pt continues to c/o pain. See MAR for meds given in pacu.  Pt will go to sleep between pain med doses and snore.  Per Dr. Andree Elk dose of dilaudid to be given then ok to go to floor.  Pt remains hemodynamically stable.  Surgical site appears WNL at this time. ?

## 2022-04-11 NOTE — Transfer of Care (Signed)
Immediate Anesthesia Transfer of Care Note ? ?Patient: Angelize Ryce ? ?Procedure(s) Performed: L4-5 MINIMALLY INVASIVE (MIS) TRANSFORAMINAL LUMBAR INTERBODY FUSION (TLIF) ?BONE MARROW ASPIRATION FOR SPINE FUSION ONLY (BMAC) ?APPLICATION OF INTRAOPERATIVE CT SCAN ? ?Patient Location: PACU ? ?Anesthesia Type:General ? ?Level of Consciousness: awake ? ?Airway & Oxygen Therapy: Patient Spontanous Breathing and Patient connected to face mask ? ?Post-op Assessment: Report given to RN and Post -op Vital signs reviewed and stable ? ?Post vital signs: Reviewed and stable ? ?Last Vitals:  ?Vitals Value Taken Time  ?BP 162/91 04/11/22 1845  ?Temp 36.3 ?C 04/11/22 1840  ?Pulse 80 04/11/22 1847  ?Resp 26 04/11/22 1847  ?SpO2 100 % 04/11/22 1847  ? ? ?Last Pain:  ?Vitals:  ? 04/11/22 1244  ?TempSrc: Temporal  ?PainSc: 5   ?   ? ?  ? ?Complications: No notable events documented. ?

## 2022-04-11 NOTE — Progress Notes (Signed)
Pharmacy consult for Vancomycin and Cefazolin for surgical prophylaxis ? ?76 yo F   wt 59 kg ? ?Will order Cefazolin 2 gm IV x1 and Vancomycin 1 gram IV x 1 for pre-op dosing ? ?Chinita Greenland PharmD ?Clinical Pharmacist ?04/11/2022 ? ?

## 2022-04-12 ENCOUNTER — Other Ambulatory Visit: Payer: Self-pay

## 2022-04-12 LAB — CBC
HCT: 34.3 % — ABNORMAL LOW (ref 36.0–46.0)
Hemoglobin: 11.3 g/dL — ABNORMAL LOW (ref 12.0–15.0)
MCH: 31.6 pg (ref 26.0–34.0)
MCHC: 32.9 g/dL (ref 30.0–36.0)
MCV: 95.8 fL (ref 80.0–100.0)
Platelets: 275 10*3/uL (ref 150–400)
RBC: 3.58 MIL/uL — ABNORMAL LOW (ref 3.87–5.11)
RDW: 13.1 % (ref 11.5–15.5)
WBC: 11.5 10*3/uL — ABNORMAL HIGH (ref 4.0–10.5)
nRBC: 0 % (ref 0.0–0.2)

## 2022-04-12 LAB — CREATININE, SERUM
Creatinine, Ser: 0.88 mg/dL (ref 0.44–1.00)
GFR, Estimated: >60 mL/min (ref >60)

## 2022-04-12 NOTE — Evaluation (Signed)
Physical Therapy Evaluation ?Patient Details ?Name: Connie Doyle ?MRN: [id number] ?DOB: Jan 30, 1946 ?Today's Date: 04/12/2022 ? ?History of Present Illness ? 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  ?Clinical Impression ? Patient agreeable to PT evaluation. She had just finished with OT and is eager to be discharged home soon. Patient required no physical assistance with mobility. She ambulated in hallway with rolling walker with lumbar brace in place and wearing orthotic for right foot due to prior toe amputations. She reports 3/10 back pain in lower back. Reinforced logroll technique with bed mobility and patient demonstrated understanding with return to bed. Recommend PT follow up to maximize independence in preparation for home discharge. The patient has supportive family at discharge.  ?   ? ?Recommendations for follow up therapy are one component of a multi-disciplinary discharge planning process, led by the attending physician.  Recommendations may be updated based on patient status, additional functional criteria and insurance authorization. ? ?Follow Up Recommendations Home health PT ? ?  ?Assistance Recommended at Discharge PRN  ?Patient can return home with the following ? Help with stairs or ramp for entrance;Assist for transportation ? ?  ?Equipment Recommendations None recommended by PT  ?Recommendations for Other Services ?    ?  ?Functional Status Assessment Patient has had a recent decline in their functional status and demonstrates the ability to make significant improvements in function in a reasonable and predictable amount of time.  ? ?  ?Precautions / Restrictions Precautions ?Precautions: Fall;Back ?Required Braces or Orthoses: Spinal Brace ?Spinal Brace: Lumbar corset (doffed in sitting position) ?Restrictions ?Weight Bearing Restrictions: No  ? ?  ? ?Mobility ? Bed  Mobility ?Overal bed mobility: Needs Assistance ?Bed Mobility: Sit to Supine ?  ?  ?Supine to sit: Supervision ?  ?  ?General bed mobility comments: verbal cues for logroll technique and patient demonstrated understanding without physical assistance. ?  ? ?Transfers ?Overall transfer level: Needs assistance ?Equipment used: Rolling walker (2 wheels) ?Transfers: Sit to/from Stand ?Sit to Stand: Supervision ?  ?  ?  ?  ?  ?General transfer comment: good safety awareness with no physical assistance required ?  ? ?Ambulation/Gait ?Ambulation/Gait assistance: Supervision ?Gait Distance (Feet): 110 Feet ?Assistive device: Rolling walker (2 wheels) (right foot orthotic) ?Gait Pattern/deviations: Step-through pattern, Decreased stride length ?Gait velocity: decreased ?  ?  ?General Gait Details: with initial verbal cues for proper rolling walker use, patient demonstrated carry over for correct technique. no loss of balance, supervision for safety. gait distance self limited due to 3/10 low back pain ? ?Stairs ?  ?  ?  ?  ?  ? ?Wheelchair Mobility ?  ? ?Modified Rankin (Stroke Patients Only) ?  ? ?  ? ?Balance Overall balance assessment: Needs assistance ?Sitting-balance support: Feet supported ?Sitting balance-Leahy Scale: Normal ?  ?  ?Standing balance support: Bilateral upper extremity supported, During functional activity, Reliant on assistive device for balance ?Standing balance-Leahy Scale: Fair ?Standing balance comment: supervision for safety ?  ?  ?  ?  ?  ?  ?  ?  ?  ?  ?  ?   ? ? ? ?Pertinent Vitals/Pain Pain Assessment ?Pain Assessment: 0-10 ?Pain Score: 3  ?Pain Location: lower back ?Pain Descriptors / Indicators: Discomfort ?Pain Intervention(s): RN gave pain meds during session, Monitored during session  ? ? ?Home Living Family/patient expects to be discharged  to:: Private residence ?Living Arrangements: Children ?Available Help at Discharge: Family;Available 24 hours/day ?Type of Home: Mobile home ?Home  Access: Stairs to enter ?Entrance Stairs-Rails: Right;Left;Can reach both ?Entrance Stairs-Number of Steps: 8 ?  ?Home Layout: One level ?Home Equipment: Cane - single point;Grab bars - tub/shower;Shower Land (2 wheels) ?Additional Comments: Patient drives  ?  ?Prior Function Prior Level of Function : Independent/Modified Independent ?  ?  ?  ?  ?  ?  ?  ?  ?  ? ? ?Hand Dominance  ? Dominant Hand: Right ? ?  ?Extremity/Trunk Assessment  ? Upper Extremity Assessment ?Upper Extremity Assessment: Overall WFL for tasks assessed;Defer to OT evaluation ?RUE Deficits / Details: R finger partial amputation to IP at thumb, PIP at D1, D5; ?LUE Deficits / Details: L finger partial amptuation at D 2 and 5 ?  ? ?Lower Extremity Assessment ?Lower Extremity Assessment: Overall WFL for tasks assessed (patient wears orthotic for right foot given prior amputations) ?  ? ?   ?Communication  ? Communication: No difficulties  ?Cognition Arousal/Alertness: Awake/alert ?Behavior During Therapy: [facility] for tasks assessed/performed ?Overall Cognitive Status: Within Functional Limits for tasks assessed ?  ?  ?  ?  ?  ?  ?  ?  ?  ?  ?  ?  ?  ?  ?  ?  ?General Comments: patient is able to follow all commands without difficulty ?  ?  ? ?  ?General Comments General comments (skin integrity, edema, etc.): vss throughout ? ?  ?Exercises Other Exercises ?Other Exercises: all out of bed mobility completed with lumbar brace in place. patient educated on logroll technique to maintain general spine precuations with activity. the patient is hopeful to discharge home soon  ? ?Assessment/Plan  ?  ?PT Assessment Patient needs continued PT services  ?PT Problem List Decreased activity tolerance;Decreased balance;Decreased mobility;Decreased safety awareness;Decreased knowledge of use of DME;Pain ? ?   ?  ?PT Treatment Interventions DME instruction;Stair training;Gait training;Functional mobility training;Therapeutic activities;Therapeutic  exercise;Balance training;Patient/family education   ? ?PT Goals (Current goals can be found in the Care Plan section)  ?Acute Rehab PT Goals ?Patient Stated Goal: to return home soon ?PT Goal Formulation: With patient ?Time For Goal Achievement: 04/26/22 ?Potential to Achieve Goals: Good ? ?  ?Frequency BID ?  ? ? ?Co-evaluation   ?  ?  ?  ?  ? ? ?  ?AM-PAC PT "6 Clicks" Mobility  ?Outcome Measure Help needed turning from your back to your side while in a flat bed without using bedrails?: None ?Help needed moving from lying on your back to sitting on the side of a flat bed without using bedrails?: A Little ?Help needed moving to and from a bed to a chair (including a wheelchair)?: A Little ?Help needed standing up from a chair using your arms (e.g., wheelchair or bedside chair)?: A Little ?Help needed to walk in hospital room?: A Little ?Help needed climbing 3-5 steps with a railing? : A Little ?6 Click Score: 19 ? ?  ?End of Session Equipment Utilized During Treatment: Back brace ?Activity Tolerance: Patient tolerated treatment well ?Patient left: in bed;with call bell/phone within reach;with bed alarm set;with SCD's reapplied ?Nurse Communication: Mobility status ?PT Visit Diagnosis: Unsteadiness on feet (R26.81);Pain;Other abnormalities of gait and mobility (R26.89) ?Pain - Right/Left:  (middle) ?Pain - part of body:  (lower back) ?  ? ?Time: [phone] ?PT Time Calculation (min) (ACUTE ONLY): 18 min ? ? ?Charges:   PT  Evaluation ?$PT Eval Low Complexity: 1 Low ?PT Treatments ?$Gait Training: 8-22 mins ?  ?   ? ? ?Minna Merritts, PT, MPT ? ? ?Percell Locus ?04/12/2022, 11:59 AM ? ?

## 2022-04-12 NOTE — Progress Notes (Signed)
Met with the patient at the bedside ?She lives with her daughter and grand son ?She has RW at home, daughter provides transportation ?She doe snot need additional DME ?She will be doing outpatient PT ?No additional needs ?

## 2022-04-12 NOTE — Progress Notes (Signed)
Physical Therapy Treatment ?Patient Details ?Name: Connie Doyle ?MRN: [id number] ?DOB: April 22, 1946 ?Today's Date: 04/12/2022 ? ? ?History of Present Illness Patient is a 76 year old female s.p  s/p L4-5 MIS TLIF for spondylolisthesis and neurogenic claudication. 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 ? ?  ?PT Comments  ? ? Patient seen for second PT session today. She continues to report 3/10 lower back pain. She declined stair training but was agreeable to ambulation. Patient continues to require supervision for bed mobility, transfers, and ambulation using rolling walker. Reviewed all general spine precautions and patient demonstrated understanding and able to maintain with functional mobility. Recommend to continue PT to maximize independence in preparation for discharge home.  ?  ?Recommendations for follow up therapy are one component of a multi-disciplinary discharge planning process, led by the attending physician.  Recommendations may be updated based on patient status, additional functional criteria and insurance authorization. ? ?Follow Up Recommendations ? Outpatient PT ?  ?  ?Assistance Recommended at Discharge PRN  ?Patient can return home with the following Help with stairs or ramp for entrance;Assist for transportation ?  ?Equipment Recommendations ? None recommended by PT  ?  ?Recommendations for Other Services   ? ? ?  ?Precautions / Restrictions Precautions ?Precautions: Fall;Back ?Required Braces or Orthoses: Spinal Brace ?Spinal Brace: Lumbar corset ?Restrictions ?Weight Bearing Restrictions: No  ?  ? ?Mobility ? Bed Mobility ?Overal bed mobility: Needs Assistance ?Bed Mobility: Supine to Sit, Sit to Supine ?  ?  ?Supine to sit: Supervision ?Sit to supine: Supervision ?  ?General bed mobility comments: with initial verbal cues for logroll technique, patient demonstrated  understanding. patient using bed rails for support. educated patient on how to perform logroll with bed flat in  home setting, although she reports she likely will be sleeping in a large recliner chair initially ?  ? ?Transfers ?Overall transfer level: Needs assistance ?Equipment used: Rolling walker (2 wheels) ?Transfers: Sit to/from Stand ?Sit to Stand: Supervision ?  ?  ?  ?  ?  ?General transfer comment: supervision for standing from multiple surfaces including bed and toilet. verbal cues for hand placement for safety ?  ? ?Ambulation/Gait ?Ambulation/Gait assistance: Supervision ?Gait Distance (Feet): 35 Feet ?Assistive device: Rolling walker (2 wheels) ?Gait Pattern/deviations: Step-through pattern ?Gait velocity: decreased ?  ?  ?General Gait Details: patient demonstrated correct technique using rolling walker. gait distance self limited as patient reports not wanting to overdo it with her current back pain of 3/10 ? ? ?Stairs ?  ?  ?  ?Number of Stairs:  (patient declined today) ?  ? ? ?Wheelchair Mobility ?  ? ?Modified Rankin (Stroke Patients Only) ?  ? ? ?  ?Balance Overall balance assessment: Needs assistance ?Sitting-balance support: Feet supported ?Sitting balance-Leahy Scale: Normal ?  ?  ?Standing balance support: Bilateral upper extremity supported, During functional activity, Reliant on assistive device for balance ?Standing balance-Leahy Scale: Fair ?Standing balance comment: supervision for safety ?  ?  ?  ?  ?  ?  ?  ?  ?  ?  ?  ?  ? ?  ?Cognition Arousal/Alertness: Awake/alert ?Behavior During Therapy: [facility] for tasks assessed/performed ?Overall Cognitive Status: Within Functional Limits for tasks assessed ?  ?  ?  ?  ?  ?  ?  ?  ?  ?  ?  ?  ?  ?  ?  ?  ?General Comments: patient able to follow single step commands without difficulty ?  ?  ? ?  ?  Exercises Other Exercises ?Other Exercises: all out of bed mobility completed with lumbar brace in place. patient educated on logroll technique to maintain general spine precuations with activity. the patient is hopeful to discharge home soon ? ?  ?General Comments  General comments (skin integrity, edema, etc.): brace donned and doffed in sitting position (was worn during ambulation) with occasional cues for proper technique. reviewed all general spine precautions ?  ?  ? ?Pertinent Vitals/Pain Pain Assessment ?Pain Assessment: 0-10 ?Pain Score: 3  ?Pain Location: lower back ?Pain Descriptors / Indicators: Discomfort ?Pain Intervention(s): Limited activity within patient's tolerance, Repositioned  ? ? ?Home Living Family/patient expects to be discharged to:: Private residence ?Living Arrangements: Children ?Available Help at Discharge: Family;Available 24 hours/day ?Type of Home: Mobile home ?Home Access: Stairs to enter ?Entrance Stairs-Rails: Right;Left;Can reach both ?Entrance Stairs-Number of Steps: 8 ?  ?Home Layout: One level ?Home Equipment: Cane - single point;Grab bars - tub/shower;Shower Land (2 wheels) ?Additional Comments: Patient drives  ?  ?Prior Function    ?  ?  ?   ? ?PT Goals (current goals can now be found in the care plan section) Acute Rehab PT Goals ?Patient Stated Goal: to return home soon ?PT Goal Formulation: With patient ?Time For Goal Achievement: 04/26/22 ?Potential to Achieve Goals: Good ?Progress towards PT goals: Progressing toward goals ? ?  ?Frequency ? ? ? BID ? ? ? ?  ?PT Plan Discharge plan needs to be updated  ? ? ?Co-evaluation   ?  ?  ?  ?  ? ?  ?AM-PAC PT "6 Clicks" Mobility   ?Outcome Measure ? Help needed turning from your back to your side while in a flat bed without using bedrails?: None ?Help needed moving from lying on your back to sitting on the side of a flat bed without using bedrails?: A Little ?Help needed moving to and from a bed to a chair (including a wheelchair)?: A Little ?Help needed standing up from a chair using your arms (e.g., wheelchair or bedside chair)?: A Little ?Help needed to walk in hospital room?: A Little ?Help needed climbing 3-5 steps with a railing? : A Little ?6 Click Score: 19 ? ?  ?End  of Session Equipment Utilized During Treatment: Back brace ?Activity Tolerance: Patient tolerated treatment well ?Patient left: in bed;with call bell/phone within reach;with bed alarm set;with SCD's reapplied ?Nurse Communication: Mobility status ?PT Visit Diagnosis: Unsteadiness on feet (R26.81);Pain;Other abnormalities of gait and mobility (R26.89) ?Pain - Right/Left:  (middle) ?Pain - part of body:  (lower back) ?  ? ? ?Time: 3729-0211 ?PT Time Calculation (min) (ACUTE ONLY): 19 min ? ?Charges:   ?$Therapeutic Activity: 8-22 mins          ?          ?Minna Merritts, PT, MPT ? ? ? ?Percell Locus ?04/12/2022, 1:44 PM ? ?

## 2022-04-12 NOTE — Care Management Important Message (Signed)
Important Message ? ?Patient Details  ?Name: Connie Doyle ?MRN: [id number] ?Date of Birth: 25-Dec-1945 ? ? ?Medicare Important Message Given:  N/A - LOS <3 / Initial given by admissions ? ? ? ? ?Dannette Barbara ?04/12/2022, 4:33 PM ?

## 2022-04-12 NOTE — Plan of Care (Signed)
  Problem: Education: Goal: Knowledge of General Education information will improve Description: Including pain rating scale, medication(s)/side effects and non-pharmacologic comfort measures Outcome: Progressing   Problem: Health Behavior/Discharge Planning: Goal: Ability to manage health-related needs will improve Outcome: Progressing   Problem: Clinical Measurements: Goal: Ability to maintain clinical measurements within normal limits will improve Outcome: Progressing Goal: Will remain free from infection Outcome: Progressing Goal: Diagnostic test results will improve Outcome: Progressing Goal: Cardiovascular complication will be avoided Outcome: Progressing   Problem: Activity: Goal: Risk for activity intolerance will decrease Outcome: Progressing   Problem: Nutrition: Goal: Adequate nutrition will be maintained Outcome: Progressing   Problem: Elimination: Goal: Will not experience complications related to bowel motility Outcome: Progressing   Problem: Pain Managment: Goal: General experience of comfort will improve Outcome: Progressing   Problem: Safety: Goal: Ability to remain free from injury will improve Outcome: Progressing   Problem: Skin Integrity: Goal: Risk for impaired skin integrity will decrease Outcome: Progressing   

## 2022-04-12 NOTE — Evaluation (Signed)
Occupational Therapy Evaluation ?Patient Details ?Name: Connie Doyle ?MRN: [id number] ?DOB: 1946-05-19 ?Today's Date: 04/12/2022 ? ? ?History of Present Illness Connie Doyle is s/p MIS L4-5 TLIF 04/11/22  ? ?Clinical Impression ?  ?Pt seen for OT evaluation this date, POD#1 from above surgery. Prior to hospital admission, pt was MOD I-I  with mobility, ADL, and IADL. Pt lives with daughter and grandson with daugther able to provide 24/7 assist/support as needed for pt. Pt educated in precautions, self care skills, AE, and home/routines modifications to maximize safety and functional independence while minimizing falls risk and maintaining precautions. Pt verbalized understanding of all education/training provided. Able to return demonstration safe techniques while maintaining precautions throughout. Handout provided to support recall and carry over of learned precautions/techniques for bed mobility, functional transfers, and self care skills. Pt performs all ADL/functional mobility at a supervision level with RW, however MAX A for LB dressing for surgical shoe, socks. Pt reports daughter can assist if needed. OT will continue to follow while admitted to address functional deficits and to facilitate safe completion of ADL within precautions.   ? ?Recommendations for follow up therapy are one component of a multi-disciplinary discharge planning process, led by the attending physician.  Recommendations may be updated based on patient status, additional functional criteria and insurance authorization.  ? ?Follow Up Recommendations ? No OT follow up  ?  ?Assistance Recommended at Discharge Intermittent Supervision/Assistance  ?Patient can return home with the following A little help with walking and/or transfers;A little help with bathing/dressing/bathroom;Assistance with cooking/housework;Help with stairs or ramp for entrance ? ?  ?Functional Status Assessment ? Patient has had a recent decline in their  functional status and demonstrates the ability to make significant improvements in function in a reasonable and predictable amount of time.  ?Equipment Recommendations ? BSC/3in1  ?  ?Recommendations for Other Services   ? ? ?  ?Precautions / Restrictions Precautions ?Precautions: Fall;Back ?Required Braces or Orthoses: Spinal Brace ?Spinal Brace: Lumbar corset;Applied in sitting position ?Restrictions ?Weight Bearing Restrictions: No  ? ?  ? ?Mobility Bed Mobility ?Overal bed mobility: Needs Assistance ?Bed Mobility: Supine to Sit ?  ?  ?Supine to sit: Supervision ?  ?  ?General bed mobility comments: with HOB raised with bed rails, intermittent vcs for approrpiate body mechanics ?  ? ?Transfers ?Overall transfer level: Needs assistance ?Equipment used: Rolling walker (2 wheels) ?Transfers: Sit to/from Stand ?Sit to Stand: Supervision ?  ?  ?  ?  ?  ?  ?  ? ?  ?Balance Overall balance assessment: Needs assistance ?Sitting-balance support: Feet supported ?Sitting balance-Leahy Scale: Normal ?  ?  ?  ?Standing balance-Leahy Scale: Fair ?  ?  ?  ?  ?  ?  ?  ?  ?  ?  ?  ?  ?   ? ?ADL either performed or assessed with clinical judgement  ? ?ADL Overall ADL's : Needs assistance/impaired ?Eating/Feeding: Independent ?  ?Grooming: Wash/dry hands;Wash/dry face;Oral care;Standing;Supervision/safety ?  ?  ?  ?  ?  ?  ?  ?Lower Body Dressing: Maximal assistance;Bed level ?Lower Body Dressing Details (indicate cue type and reason): sock and surgical shoe for R foot ?Toilet Transfer: Supervision/safety;BSC/3in1 ?  ?Toileting- Clothing Manipulation and Hygiene: Supervision/safety;Sit to/from stand ?Toileting - Clothing Manipulation Details (indicate cue type and reason): with RW ?  ?  ?Functional mobility during ADLs: Supervision/safety;Rolling walker (2 wheels) ?   ? ? ? ?Vision Baseline Vision/History: 1 Wears glasses ?Patient Visual Report:  No change from baseline ?   ?   ?Perception   ?  ?Praxis   ?  ? ?Pertinent Vitals/Pain  Pain Assessment ?Pain Assessment: 0-10 ?Pain Score: 2  ?Pain Location: lower back ?Pain Descriptors / Indicators: Aching, Discomfort, Burning ?Pain Intervention(s): Limited activity within patient's tolerance, Monitored during session, Repositioned  ? ? ? ?Hand Dominance Right ?  ?Extremity/Trunk Assessment Upper Extremity Assessment ?Upper Extremity Assessment: Overall WFL for tasks assessed;RUE deficits/detail;LUE deficits/detail ?RUE Deficits / Details: R finger partial amputation to IP at thumb, PIP at D1, D5; ?LUE Deficits / Details: L finger partial amptuation at D 2 and 5 ?  ?Lower Extremity Assessment ?Lower Extremity Assessment: Defer to PT evaluation ?  ?  ?  ?Communication Communication ?Communication: No difficulties ?  ?Cognition Arousal/Alertness: Awake/alert ?Behavior During Therapy: [facility] for tasks assessed/performed ?Overall Cognitive Status: Within Functional Limits for tasks assessed ?  ?  ?  ?  ?  ?  ?  ?  ?  ?  ?  ?  ?  ?  ?  ?  ?  ?  ?  ?General Comments  vss throughout ? ?  ?Exercises   ?  ?Shoulder Instructions    ? ? ?Home Living Family/patient expects to be discharged to:: Private residence ?Living Arrangements: Children ?Available Help at Discharge: Family;Available 24 hours/day ?Type of Home: Mobile home ?Home Access: Stairs to enter ?Entrance Stairs-Number of Steps: 8 ?Entrance Stairs-Rails: Right;Left;Can reach both ?Home Layout: One level ?  ?  ?Bathroom Shower/Tub: Tub/shower unit;Walk-in shower ?  ?Bathroom Toilet: Standard ?Bathroom Accessibility: Yes ?  ?Home Equipment: Cane - single point;Grab bars - tub/shower;Shower seat;Rolling Environmental consultant (2 wheels) (knee scooter) ?  ?  ?  ? ?  ?Prior Functioning/Environment Prior Level of Function : Independent/Modified Independent ?  ?  ?  ?  ?  ?  ?  ?  ?  ? ?  ?  ?OT Problem List: Decreased strength;Decreased activity tolerance ?  ?   ?OT Treatment/Interventions: Self-care/ADL training;Therapeutic exercise;Patient/family education;Therapeutic  activities;DME and/or AE instruction  ?  ?OT Goals(Current goals can be found in the care plan section) Acute Rehab OT Goals ?Patient Stated Goal: go home ?OT Goal Formulation: With patient ?Time For Goal Achievement: 04/26/22 ?Potential to Achieve Goals: Good ?ADL Goals ?Pt Will Perform Lower Body Dressing: with modified independence;with adaptive equipment ?Pt Will Transfer to Toilet: with modified independence  ?OT Frequency: Min 2X/week ?  ? ?Co-evaluation   ?  ?  ?  ?  ? ?  ?AM-PAC OT "6 Clicks" Daily Activity     ?Outcome Measure Help from another person eating meals?: None ?Help from another person taking care of personal grooming?: None ?Help from another person toileting, which includes using toliet, bedpan, or urinal?: None ?Help from another person bathing (including washing, rinsing, drying)?: None ?Help from another person to put on and taking off regular upper body clothing?: None ?Help from another person to put on and taking off regular lower body clothing?: A Lot ?6 Click Score: 22 ?  ?End of Session Equipment Utilized During Treatment: Gait belt;Rolling walker (2 wheels);Other (comment) (brace, personal surgical shoe) ?Nurse Communication: Mobility status ? ?Activity Tolerance: Patient tolerated treatment well ?Patient left: in chair;with call bell/phone within reach;Other (comment) (in care of PT) ? ?OT Visit Diagnosis: Unsteadiness on feet (R26.81)  ?              ?Time: 4268-3419 ?OT Time Calculation (min): 34 min ?Charges:  OT General Charges ?$OT  Visit: 1 Visit ?OT Evaluation ?$OT Eval Low Complexity: 1 Low ?OT Treatments ?$Self Care/Home Management : 8-22 mins ? ?Shanon Payor, OTD OTR/L  ?04/12/22, 9:43 AM  ?

## 2022-04-12 NOTE — Progress Notes (Signed)
? ?   Attending Progress Note ? ?History: Connie Doyle is s/p MIS L4-5 TLIF  ? ?POD1: Complains of back pain overnight but overall feels she is tolerating things well.  Has no acute complaints this morning. ? ?Physical Exam: ?Vitals:  ? 04/11/22 1958 04/12/22 0415  ?BP: (!) 183/87 (!) 161/77  ?Pulse: 70 63  ?Resp: 18 20  ?Temp: (!) 97 ?F (36.1 ?C) 98.9 ?F (37.2 ?C)  ?SpO2: 100% 97%  ? ? ?AA Ox3 ?CNI ? ?Strength:5/5 throughout BLE except R PF, DF, and EHL due to amputation. ? ?Data: ? ?Recent Labs  ?Lab 04/12/22 ?3716  ?CREATININE 0.88  ? ?No results for input(s): AST, ALT, ALKPHOS in the last 168 hours. ? ?Invalid input(s): TBILI  ? Recent Labs  ?Lab 04/12/22 ?9678  ?WBC 11.5*  ?HGB 11.3*  ?HCT 34.3*  ?PLT 275  ? ?No results for input(s): APTT, INR in the last 168 hours.  ?   ? ? ?Other tests/results: none  ? ?Assessment/Plan: ? 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 ? ?- mobilize ?- pain control ?- DVT prophylaxis ?- PTOT; Pt is established with [facility] at discharge  ? ?Cooper Render PA-C ?Department of Neurosurgery ? ?  ?

## 2022-04-13 ENCOUNTER — Encounter: Payer: Self-pay | Admitting: Neurosurgery

## 2022-04-13 MED ORDER — SENNA 8.6 MG PO TABS: 1.0000 | ORAL_TABLET | Freq: Two times a day (BID) | ORAL | 0 refills | Status: AC | PRN

## 2022-04-13 MED ORDER — CELECOXIB 200 MG PO CAPS
200.0000 mg | ORAL_CAPSULE | Freq: Two times a day (BID) | ORAL | 0 refills | Status: DC
Start: 2022-04-13 — End: 2022-07-10

## 2022-04-13 MED ORDER — METHOCARBAMOL 500 MG PO TABS: 500.0000 mg | ORAL_TABLET | Freq: Four times a day (QID) | ORAL | 0 refills | Status: AC

## 2022-04-13 MED ORDER — CELECOXIB 200 MG PO CAPS
200.0000 mg | ORAL_CAPSULE | Freq: Two times a day (BID) | ORAL | Status: DC
  Administered 2022-04-13: 200 mg via ORAL
  Filled 2022-04-13: qty 1

## 2022-04-13 MED ORDER — OXYCODONE HCL 10 MG PO TABS: 10.0000 mg | ORAL_TABLET | ORAL | 0 refills | Status: AC | PRN

## 2022-04-13 NOTE — Discharge Instructions (Addendum)
NEUROSURGERY DISCHARGE INSTRUCTIONS ? ?Admission diagnosis: S/P lumbar fusion [Z98.1] ? ?Operative procedure: MIS L4-5 TLIF  ? ?What to do after you leave the hospital: ? ?Recommended diet: regular diet. Increase protein intake to promote wound healing. ? ?Recommended activity: no lifting, driving, or strenuous exercise for 4 weeks . You should walk multiple times per day ? ?Special Instructions ? ?No straining, no heavy lifting > 10lbs x 4 weeks.  Keep incision area clean and dry. May shower in 2 days. No baths or pools for 6 weeks.  ?Please remove dressing tomorrow, no need to apply a bandage afterwards ? ?You have sutures or staples that will be removed in clinic.  ? ?Please take pain medications as directed. Take a stool softener if on pain medications ? ? ?Please Report any of the following: ?Nausea or Vomiting, Temperature is greater than 101.75F (38.1C) degrees, Dizziness, Abdominal Pain, Difficulty Breathing or Shortness of Breath, Inability to Eat, drink Fluids, or Take medications, Bleeding, swelling, or drainage from surgical incision sites, New numbness or weakness, and Bowel or bladder dysfunction to the neurosurgeon on call at [phone] ? ?Additional Follow up appointments ?Please follow up with Cooper Render PA-C in [facility] as scheduled in 2-3 weeks ? ? ?Please see below for scheduled appointments: ? ?No future appointments. ? ?  ?

## 2022-04-13 NOTE — Progress Notes (Signed)
? ?   Attending Progress Note ? ?History: Janeil Schexnayder is s/p MIS L4-5 TLIF  ? ?POD2: back pain overnight. Better controlled this morning. Continues improved leg pain ? ?POD1: Complains of back pain overnight but overall feels she is tolerating things well.  Has no acute complaints this morning. ? ?Physical Exam: ?Vitals:  ? 04/12/22 2038 04/12/22 2345  ?BP: (!) 122/56 130/67  ?Pulse: 68 73  ?Resp: 20 20  ?Temp: 98.4 ?F (36.9 ?C) 98.4 ?F (36.9 ?C)  ?SpO2: 98% 98%  ? ? ?AA Ox3 ?CNI ? ?Strength:5/5 throughout BLE except R PF, DF, and EHL due to amputation. ? ?Data: ? ?Recent Labs  ?Lab 04/12/22 ?2563  ?CREATININE 0.88  ? ? ?No results for input(s): AST, ALT, ALKPHOS in the last 168 hours. ? ?Invalid input(s): TBILI  ? Recent Labs  ?Lab 04/12/22 ?8937  ?WBC 11.5*  ?HGB 11.3*  ?HCT 34.3*  ?PLT 275  ? ? ?No results for input(s): APTT, INR in the last 168 hours.  ?   ? ? ?Other tests/results: none  ? ?Assessment/Plan: ? 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 ? ?- mobilize ?- pain control ?- DVT prophylaxis ?- PTOT; Pt is established with [facility] at discharge  ?- discharge pending PT eval ? ?Cooper Render PA-C ?Department of Neurosurgery ? ?  ?

## 2022-04-13 NOTE — Progress Notes (Signed)
Physical Therapy Treatment ?Patient Details ?Name: Connie Doyle ?MRN: [id number] ?DOB: 12/23/1946 ?Today's Date: 04/13/2022 ? ? ?History of Present Illness Patient is a 76 year old female s.p  s/p L4-5 MIS TLIF for spondylolisthesis and neurogenic claudication. 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 ? ?  ?PT Comments  ? ? OOB with rail and increased time. She is able to don brace with verbal cues and complete x 1 lap and stair training.  Remained up in chair with questions answered.   Progressing well towards goals.  ?Recommendations for follow up therapy are one component of a multi-disciplinary discharge planning process, led by the attending physician.  Recommendations may be updated based on patient status, additional functional criteria and insurance authorization. ? ?Follow Up Recommendations ? Outpatient PT ?  ?  ?Assistance Recommended at Discharge PRN  ?Patient can return home with the following Help with stairs or ramp for entrance;Assist for transportation ?  ?Equipment Recommendations ? None recommended by PT  ?  ?Recommendations for Other Services   ? ? ?  ?Precautions / Restrictions Precautions ?Precautions: Fall;Back ?Required Braces or Orthoses: Spinal Brace ?Spinal Brace: Lumbar corset ?Restrictions ?Weight Bearing Restrictions: No  ?  ? ?Mobility ? Bed Mobility ?Overal bed mobility: Modified Independent ?Bed Mobility: Supine to Sit ?  ?  ?Supine to sit: Supervision, Modified independent (Device/Increase time) ?  ?  ?General bed mobility comments: rail and increased time but no assist. ?  ? ?Transfers ?Overall transfer level: Needs assistance ?Equipment used: Rolling walker (2 wheels) ?Transfers: Sit to/from Stand ?Sit to Stand: Supervision ?  ?  ?  ?  ?  ?  ?  ? ?Ambulation/Gait ?Ambulation/Gait assistance: Supervision ?Gait Distance (Feet): 200 Feet ?Assistive device: Rolling walker (2 wheels) ?Gait Pattern/deviations: Step-through pattern ?Gait velocity: decreased ?  ?  ?   ? ? ?Stairs ?Stairs: Yes ?Stairs assistance: Min guard ?Stair Management: Two rails ?Number of Stairs: 4 ?General stair comments: with ease ? ? ?Wheelchair Mobility ?  ? ?Modified Rankin (Stroke Patients Only) ?  ? ? ?  ?Balance Overall balance assessment: Needs assistance ?Sitting-balance support: Feet supported ?Sitting balance-Leahy Scale: Normal ?  ?  ?Standing balance support: Bilateral upper extremity supported, During functional activity, Reliant on assistive device for balance ?Standing balance-Leahy Scale: Fair ?Standing balance comment: supervision for safety ?  ?  ?  ?  ?  ?  ?  ?  ?  ?  ?  ?  ? ?  ?Cognition Arousal/Alertness: Awake/alert ?Behavior During Therapy: [facility] for tasks assessed/performed ?Overall Cognitive Status: Within Functional Limits for tasks assessed ?  ?  ?  ?  ?  ?  ?  ?  ?  ?  ?  ?  ?  ?  ?  ?  ?General Comments: patient able to follow single step commands without difficulty ?  ?  ? ?  ?Exercises   ? ?  ?General Comments   ?  ?  ? ?Pertinent Vitals/Pain Pain Assessment ?Pain Assessment: Faces ?Faces Pain Scale: Hurts a little bit ?Pain Location: lower back ?Pain Descriptors / Indicators: Discomfort ?Pain Intervention(s): Premedicated before session, Repositioned, Limited activity within patient's tolerance, Monitored during session  ? ? ?Home Living   ?  ?  ?  ?  ?  ?  ?  ?  ?  ?   ?  ?Prior Function    ?  ?  ?   ? ?PT Goals (current goals can now be found  in the care plan section) Progress towards PT goals: Progressing toward goals ? ?  ?Frequency ? ? ? BID ? ? ? ?  ?PT Plan Discharge plan needs to be updated  ? ? ?Co-evaluation   ?  ?  ?  ?  ? ?  ?AM-PAC PT "6 Clicks" Mobility   ?Outcome Measure ? Help needed turning from your back to your side while in a flat bed without using bedrails?: None ?Help needed moving from lying on your back to sitting on the side of a flat bed without using bedrails?: A Little ?Help needed moving to and from a bed to a chair (including a wheelchair)?: A  Little ?Help needed standing up from a chair using your arms (e.g., wheelchair or bedside chair)?: A Little ?Help needed to walk in hospital room?: A Little ?Help needed climbing 3-5 steps with a railing? : A Little ?6 Click Score: 19 ? ?  ?End of Session Equipment Utilized During Treatment: Back brace ?Activity Tolerance: Patient tolerated treatment well ?Patient left: in bed;with call bell/phone within reach;with bed alarm set;with SCD's reapplied ?Nurse Communication: Mobility status ?PT Visit Diagnosis: Unsteadiness on feet (R26.81);Pain;Other abnormalities of gait and mobility (R26.89) ?Pain - Right/Left:  (middle) ?Pain - part of body:  (lower back) ?  ? ? ?Time: 3382-5053 ?PT Time Calculation (min) (ACUTE ONLY): 11 min ? ?Charges:  $Gait Training: 8-22 mins          ?         Chesley Noon, PTA ?04/13/22, 9:16 AM ? ?

## 2022-04-13 NOTE — Discharge Summary (Signed)
Physician Discharge Summary  ?Patient ID: ?Connie Doyle ?MRN: [id number] ?DOB/AGE: 04/28/1946 76 y.o. ? ?Admit date: 04/11/2022 ?Discharge date: 04/13/2022 ? ?Admission Diagnoses:  ?Anterolisthesis M43.10, Neurogenic claudication due to lumbar spinal stenosis M48.062 ? ?Discharge Diagnoses:  ?Principal Problem: ?  S/P lumbar fusion ? ? ?Discharged Condition: good ? ?Hospital Course:  ?Connie MEDELLIN is a 76 y.o s/p MIS L4-5 TLIF. Her interoperative course was uncomplicated and she was admitted for pain control and therapy. She did well and was deemed appropriate for discharge on POD 2. ? ?Consults: None ? ?Significant Diagnostic Studies: none  ? ?Treatments: surgery: as above. Please see separately dictated operative report for further details ? ?Discharge Exam: ?Blood pressure (!) 116/44, pulse 77, temperature 98.4 ?F (36.9 ?C), temperature source Oral, resp. rate 16, SpO2 99 %. ? ?AA Ox3 ?CNI ?  ?Strength:5/5 throughout BLE except R PF, DF, and EHL due to amputation. ? ?Disposition:  ?Discharge disposition: 06-Home-Health Care Svc ? ? ? ? ? ? ?Discharge Instructions   ? ? Incentive spirometry RT   Complete by: As directed ?  ? Remove dressing in 24 hours   Complete by: As directed ?  ? ?  ? ?Allergies as of 04/13/2022   ? ?   Reactions  ? Bacitracin Swelling  ? ?  ? ?  ?Medication List  ?  ? ?STOP taking these medications   ? ?baclofen 10 MG tablet ?Commonly known as: LIORESAL ?  ?meloxicam 7.5 MG tablet ?Commonly known as: MOBIC ?  ? ?  ? ?TAKE these medications   ? ?acetaminophen 500 MG tablet ?Commonly known as: TYLENOL ?Take 500 mg by mouth every 6 (six) hours as needed. ?  ?alendronate 70 MG tablet ?Commonly known as: FOSAMAX ?Take 70 mg by mouth once a week. Take with a full glass of water on an empty stomach. ?  ?celecoxib 200 MG capsule ?Commonly known as: CELEBREX ?Take 1 capsule (200 mg total) by mouth 2 (two) times daily. ?  ?cyanocobalamin 1000 MCG tablet ?Take 1,000 mcg by mouth daily. ?  ?gabapentin  400 MG capsule ?Commonly known as: NEURONTIN ?Take 800 mg by mouth 3 (three) times daily. 2 capsules TID, May take additional 2 capsules at HS PRN ?  ?lidocaine 5 % ?Commonly known as: LIDODERM ?Place 1 patch onto the skin daily. APPLY 1 PATCH TO THE MOST PAINFUL AREA ON THE SKIN DAILY FOR 30 DAYS FOR UP TO 12 HOURS PER 24 HRS ?  ?losartan 50 MG tablet ?Commonly known as: COZAAR ?Take 50 mg by mouth daily. ?  ?methocarbamol 500 MG tablet ?Commonly known as: ROBAXIN ?Take 1 tablet (500 mg total) by mouth every 6 (six) hours. ?  ?Oxycodone HCl 10 MG Tabs ?Take 1 tablet (10 mg total) by mouth every 3 (three) hours as needed for up to 5 days for severe pain ((score 7 to 10)). ?What changed:  ?medication strength ?how much to take ?when to take this ?reasons to take this ?  ?pravastatin 40 MG tablet ?Commonly known as: PRAVACHOL ?Take 40 mg by mouth daily. ?  ?PRESERVISION AREDS PO ?Take by mouth daily. ?  ?PRESERVISION AREDS PO ?Take by mouth. ?  ?senna 8.6 MG Tabs tablet ?Commonly known as: SENOKOT ?Take 1 tablet (8.6 mg total) by mouth 2 (two) times daily as needed for mild constipation. ?  ?VITAMIN D3 PO ?Take 1,000 Units by mouth. ?  ? ?  ? ? Follow-up Information   ? ? Loleta Dicker, PA Follow up  in 1 week(s).   ?Why: for post-op and incision check. This appointment date and time are on your pre-op paperwork. Please call the office with any questions or concerns regarding appointment date or time ?Contact information: ?[place] [address] ?[phone] ? ? ?  ?  ? ?  ?  ? ?  ? ? ?Signed: ?Loleta Dicker ?04/13/2022, 1:46 PM ? ? ?

## 2022-04-13 NOTE — Progress Notes (Signed)
Occupational Therapy Treatment ?Patient Details ?Name: Connie Doyle ?MRN: [id number] ?DOB: [birth year] ?Today's Date: 04/13/2022 ? ? ?History of present illness Patient is a 76 year old female s.p  s/p L4-5 MIS TLIF for spondylolisthesis and neurogenic claudication. 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 ?  ?OT comments ? Chart reviewed, pt greeted in bed reporting 5/10 pain but agreeable to OT tx session. RN present during session to provide pain meds. Pt performs all mobility/ADL at supervision level. Pt is with increased pain today as compared to evaluation. Education provided re: energy conservation, home safety, falls prevention. Pt is left as received, NAD, all needs met. OT will continue to follow while admitted. Discharge recommendation remains appropriate.    ? ?Recommendations for follow up therapy are one component of a multi-disciplinary discharge planning process, led by the attending physician.  Recommendations may be updated based on patient status, additional functional criteria and insurance authorization. ?   ?Follow Up Recommendations ? No OT follow up  ?  ?Assistance Recommended at Discharge Intermittent Supervision/Assistance  ?Patient can return home with the following ? A little help with walking and/or transfers;A little help with bathing/dressing/bathroom;Assistance with cooking/housework;Help with stairs or ramp for entrance ?  ?Equipment Recommendations ? BSC/3in1  ?  ?Recommendations for Other Services   ? ?  ?Precautions / Restrictions Precautions ?Precautions: Fall;Back ?Required Braces or Orthoses: Spinal Brace ?Spinal Brace: Lumbar corset ?Restrictions ?Weight Bearing Restrictions: No  ? ? ?  ? ?Mobility Bed Mobility ?  ?  ?  ?  ?  ?  ?  ?  ?  ? ?Transfers ?  ?  ?  ?  ?  ?  ?  ?  ?  ?  ?  ?  ?Balance   ?  ?  ?  ?  ?  ?  ?  ?  ?  ?  ?  ?  ?  ?  ?  ?  ?  ?  ?   ? ?ADL either performed or assessed with clinical judgement  ? ?ADL Overall ADL's : Needs assistance/impaired ?   ?  ?  ?  ?  ?  ?  ?  ?  ?  ?  ?  ?  ?  ?  ?  ?  ?  ?  ?General ADL Comments: supine<>sit with supervision using appropriate body mechanics, supervision for donning brace at edge of bed, STS with supervision with RW, amb to bathroom with RW with supervision toileting tasks with supervision, standing grooming tasks at sink level with supervision ?  ? ?Extremity/Trunk Assessment   ?  ?  ?  ?  ?  ? ?Vision   ?  ?  ?Perception   ?  ?Praxis   ?  ? ?Cognition Arousal/Alertness: Awake/alert ?Behavior During Therapy: [facility] for tasks assessed/performed ?Overall Cognitive Status: Within Functional Limits for tasks assessed ?  ?  ?  ?  ?  ?  ?  ?  ?  ?  ?  ?  ?  ?  ?  ?  ?  ?  ?  ?   ?Exercises   ? ?  ?Shoulder Instructions   ? ? ?  ?General Comments    ? ? ?Pertinent Vitals/ Pain       Pain Assessment ?Pain Assessment: 0-10 ?Pain Score: 5  ?Pain Location: lower back ?Pain Descriptors / Indicators: Discomfort ?Pain Intervention(s): Limited activity within patient's tolerance, Monitored during session, Repositioned, Patient requesting pain meds-RN  notified ? ?Home Living   ?  ?  ?  ?  ?  ?  ?  ?  ?  ?  ?  ?  ?  ?  ?  ?  ?  ?  ? ?  ?Prior Functioning/Environment    ?  ?  ?  ?   ? ?Frequency ? Min 2X/week  ? ? ? ? ?  ?Progress Toward Goals ? ?OT Goals(current goals can now be found in the care plan section) ? Progress towards OT goals: Progressing toward goals ? ?Acute Rehab OT Goals ?Patient Stated Goal: go home ?OT Goal Formulation: With patient ?Time For Goal Achievement: 04/27/22 ?Potential to Achieve Goals: Good  ?Plan Discharge plan remains appropriate   ? ?Co-evaluation ? ? ?   ?  ?  ?  ?  ? ?  ?AM-PAC OT "6 Clicks" Daily Activity     ?Outcome Measure ? ? Help from another person eating meals?: None ?Help from another person taking care of personal grooming?: None ?  ?Help from another person bathing (including washing, rinsing, drying)?: None ?Help from another person to put on and taking off regular upper body clothing?:  None ?Help from another person to put on and taking off regular lower body clothing?: A Lot ?6 Click Score: 18 ? ?  ?End of Session Equipment Utilized During Treatment: Gait belt;Rolling walker (2 wheels) ? ?OT Visit Diagnosis: Unsteadiness on feet (R26.81) ?  ?Activity Tolerance Patient tolerated treatment well ?  ?Patient Left in bed;with call bell/phone within reach;with bed alarm set ?  ?Nurse Communication Mobility status;Patient requests pain meds ?  ? ?   ? ?Time: 2072-1828 ?OT Time Calculation (min): 17 min ? ?Charges: OT General Charges ?$OT Visit: 1 Visit ?OT Treatments ?$Self Care/Home Management : 8-22 mins ? ?Shanon Payor, OTD OTR/L  ?04/13/22, 12:27 PM  ?

## 2022-04-13 NOTE — Progress Notes (Addendum)
1414 ?Pt has passed PT, pain control and would like to go home. D/c orders in pt will call daughter ? ?59 ?Dc avs reviewed with pt all questions and concerns answered. IV removed. Awaiting ride ?

## 2022-04-16 NOTE — Anesthesia Postprocedure Evaluation (Signed)
Anesthesia Post Note ? ?Patient: Connie Doyle ? ?Procedure(s) Performed: L4-5 MINIMALLY INVASIVE (MIS) TRANSFORAMINAL LUMBAR INTERBODY FUSION (TLIF) ?BONE MARROW ASPIRATION FOR SPINE FUSION ONLY (BMAC) ?APPLICATION OF INTRAOPERATIVE CT SCAN ? ?Patient location during evaluation: PACU ?Anesthesia Type: General ?Level of consciousness: awake and alert ?Pain management: pain level controlled ?Vital Signs Assessment: post-procedure vital signs reviewed and stable ?Respiratory status: spontaneous breathing, nonlabored ventilation, respiratory function stable and patient connected to nasal cannula oxygen ?Cardiovascular status: blood pressure returned to baseline and stable ?Postop Assessment: no apparent nausea or vomiting ?Anesthetic complications: no ? ? ?No notable events documented. ? ? ?Last Vitals:  ?Vitals:  ? 04/13/22 0738 04/13/22 1110  ?BP: (!) 142/70 (!) 116/44  ?Pulse: 76 77  ?Resp: 16 16  ?Temp: 36.7 ?C 36.9 ?C  ?SpO2: 97% 99%  ?  ?Last Pain:  ?Vitals:  ? 04/13/22 1115  ?TempSrc:   ?PainSc: 8   ? ? ?  ?  ?  ?  ?  ?  ? ?Molli Barrows ? ? ? ? ?

## 2022-04-17 ENCOUNTER — Encounter: Payer: Self-pay | Admitting: Neurosurgery

## 2022-05-03 ENCOUNTER — Other Ambulatory Visit: Payer: Self-pay | Admitting: Gerontology

## 2022-05-03 DIAGNOSIS — Z1231 Encounter for screening mammogram for malignant neoplasm of breast: Secondary | ICD-10-CM

## 2022-07-10 ENCOUNTER — Other Ambulatory Visit: Payer: Self-pay

## 2022-07-11 MED ORDER — CELECOXIB 200 MG PO CAPS: 200.0000 mg | ORAL_CAPSULE | Freq: Two times a day (BID) | ORAL | 0 refills | Status: DC

## 2022-08-20 ENCOUNTER — Other Ambulatory Visit: Payer: Self-pay

## 2022-08-20 DIAGNOSIS — Z981 Arthrodesis status: Secondary | ICD-10-CM

## 2022-08-21 ENCOUNTER — Encounter: Payer: Self-pay | Admitting: Neurosurgery

## 2022-08-21 ENCOUNTER — Ambulatory Visit (INDEPENDENT_AMBULATORY_CARE_PROVIDER_SITE_OTHER): Payer: Medicare Other | Admitting: Neurosurgery

## 2022-08-21 ENCOUNTER — Ambulatory Visit
Admission: RE | Admit: 2022-08-21 | Discharge: 2022-08-21 | Disposition: A | Discharge status: Home / Self Care | Payer: Medicare Other | Attending: Neurosurgery | Admitting: Neurosurgery

## 2022-08-21 ENCOUNTER — Ambulatory Visit
Admission: RE | Admit: 2022-08-21 | Discharge: 2022-08-21 | Disposition: A | Discharge status: Home / Self Care | Payer: Medicare Other | Source: Ambulatory Visit | Attending: Neurosurgery | Admitting: Neurosurgery

## 2022-08-21 VITALS — BP 168/85 | HR 56 | Ht 61.0 in | Wt 130.0 lb

## 2022-08-21 DIAGNOSIS — M48062 Spinal stenosis, lumbar region with neurogenic claudication: Secondary | ICD-10-CM | POA: Diagnosis not present

## 2022-08-21 DIAGNOSIS — M4316 Spondylolisthesis, lumbar region: Secondary | ICD-10-CM | POA: Insufficient documentation

## 2022-08-21 DIAGNOSIS — Z981 Arthrodesis status: Secondary | ICD-10-CM | POA: Insufficient documentation

## 2022-08-21 DIAGNOSIS — Z09 Encounter for follow-up examination after completed treatment for conditions other than malignant neoplasm: Secondary | ICD-10-CM | POA: Diagnosis not present

## 2022-08-21 NOTE — Progress Notes (Signed)
   DOS: 04/11/22 (L4-5 TLIF)  HISTORY OF PRESENT ILLNESS: 08/21/2022 [name] is status post the above procedure.  She is doing very well.  She has minimal back pain.   PHYSICAL EXAMINATION:   Vitals:   08/21/22 1116  BP: (!) 168/85  Pulse: (!) 56   General: Patient is well developed, well nourished, calm, collected, and in no apparent distress.  NEUROLOGICAL:  General: In no acute distress.  Awake, alert, oriented to person, place, and time. Pupils equal round and reactive to light.   Strength:  Side Iliopsoas Quads Hamstring PF DF EHL  R '5 5 5 5 5 5  '$ L '5 5 5 5 5 5   '$ Incision c/d/i   ROS (Neurologic): Negative except as noted above  IMAGING: No complications noted ASSESSMENT/PLAN:  Connie Doyle is doing well after L4-5 transforaminal lumbar interbody fusion.  She is off limitations.  I will see her back in 6 months with x-rays.  She will continue naproxen for now.  I spent a total of 10 minutes in face-to-face and non-face-to-face activities related to this patient's care today.   Meade Maw MD, [facility] Department of Neurosurgery

## 2022-08-27 ENCOUNTER — Other Ambulatory Visit: Payer: Self-pay | Admitting: Neurosurgery

## 2023-02-20 ENCOUNTER — Other Ambulatory Visit: Payer: Self-pay

## 2023-02-20 DIAGNOSIS — Z981 Arthrodesis status: Secondary | ICD-10-CM

## 2023-02-21 ENCOUNTER — Encounter: Payer: Self-pay | Admitting: Neurosurgery

## 2023-02-21 ENCOUNTER — Ambulatory Visit
Admission: RE | Admit: 2023-02-21 | Discharge: 2023-02-21 | Disposition: A | Discharge status: Home / Self Care | Payer: Medicare Other | Attending: Neurosurgery | Admitting: Neurosurgery

## 2023-02-21 ENCOUNTER — Ambulatory Visit
Admission: RE | Admit: 2023-02-21 | Discharge: 2023-02-21 | Disposition: A | Discharge status: Home / Self Care | Payer: Medicare Other | Source: Ambulatory Visit | Attending: Neurosurgery | Admitting: Neurosurgery

## 2023-02-21 ENCOUNTER — Ambulatory Visit (INDEPENDENT_AMBULATORY_CARE_PROVIDER_SITE_OTHER): Payer: Medicare Other | Admitting: Neurosurgery

## 2023-02-21 VITALS — BP 126/80 | Ht 61.0 in | Wt 126.4 lb

## 2023-02-21 DIAGNOSIS — M47816 Spondylosis without myelopathy or radiculopathy, lumbar region: Secondary | ICD-10-CM | POA: Diagnosis not present

## 2023-02-21 DIAGNOSIS — M47817 Spondylosis without myelopathy or radiculopathy, lumbosacral region: Secondary | ICD-10-CM | POA: Insufficient documentation

## 2023-02-21 DIAGNOSIS — Z981 Arthrodesis status: Secondary | ICD-10-CM

## 2023-02-21 DIAGNOSIS — M4310 Spondylolisthesis, site unspecified: Secondary | ICD-10-CM | POA: Diagnosis not present

## 2023-02-21 DIAGNOSIS — M4316 Spondylolisthesis, lumbar region: Secondary | ICD-10-CM | POA: Insufficient documentation

## 2023-02-21 DIAGNOSIS — Z09 Encounter for follow-up examination after completed treatment for conditions other than malignant neoplasm: Secondary | ICD-10-CM

## 2023-02-21 DIAGNOSIS — M48062 Spinal stenosis, lumbar region with neurogenic claudication: Secondary | ICD-10-CM

## 2023-02-21 NOTE — Progress Notes (Signed)
   DOS: 04/11/22 (L4-5 TLIF)  HISTORY OF PRESENT ILLNESS: 02/21/2023 [name] is status post the above procedure.  She is doing very well.  She has minimal back pain.   PHYSICAL EXAMINATION:   Vitals:   02/21/23 1123  BP: 126/80    General: Patient is well developed, well nourished, calm, collected, and in no apparent distress.  NEUROLOGICAL:  General: In no acute distress.  Awake, alert, oriented to person, place, and time. Pupils equal round and reactive to light.   Strength:  Side Iliopsoas Quads Hamstring PF DF EHL  R 5 5 5 5 5 5  $ L 5 5 5 5 5 5   $ Incision c/d/i   ROS (Neurologic): Negative except as noted above  IMAGING: No complications noted  ASSESSMENT/PLAN:  Connie Doyle is doing well after L4-5 transforaminal lumbar interbody fusion.  She is off limitations.  I will see her back as needed. I spent a total of 10 minutes in face-to-face and non-face-to-face activities related to this patient's care today.   Meade Maw MD, [facility] Department of Neurosurgery

## 2023-05-09 ENCOUNTER — Other Ambulatory Visit: Payer: Self-pay | Admitting: Gerontology

## 2023-05-09 DIAGNOSIS — Z1231 Encounter for screening mammogram for malignant neoplasm of breast: Secondary | ICD-10-CM

## 2023-06-17 ENCOUNTER — Ambulatory Visit
Admission: RE | Admit: 2023-06-17 | Discharge: 2023-06-17 | Disposition: A | Discharge status: Home / Self Care | Payer: Medicare Other | Source: Ambulatory Visit | Attending: Gerontology | Admitting: Gerontology

## 2023-06-17 DIAGNOSIS — Z1231 Encounter for screening mammogram for malignant neoplasm of breast: Secondary | ICD-10-CM | POA: Diagnosis present

## 2023-09-24 IMAGING — CT DG LUMBAR SPINE 2-3V
4 of 6 series · 16 of 33 positions shown, 18 images · non-contrast
Comparison: Preoperative lumbar spine MRI 08/15/2021

CLINICAL DATA: TLIF L4-L5

EXAM:
LUMBAR SPINE - 2-3 VIEW

[Series 6: — · coronal · 0.36mm/px · 1 of 167 slices shown (1 of 4)]
[im 84/167  bone]
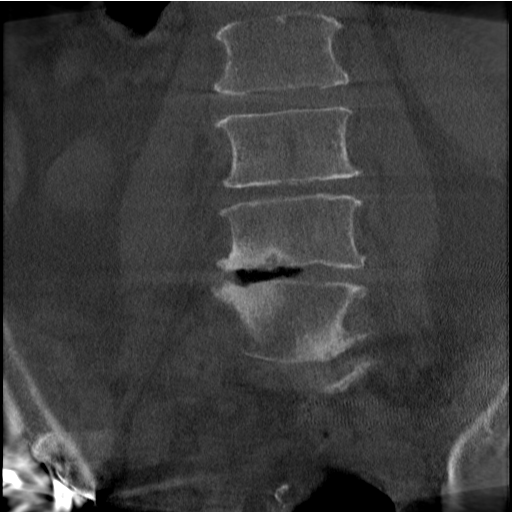

[Series 6: — · sagittal · 0.36mm/px · 5 of 169 slices shown (2 of 4)]
[im 29/169  bone]
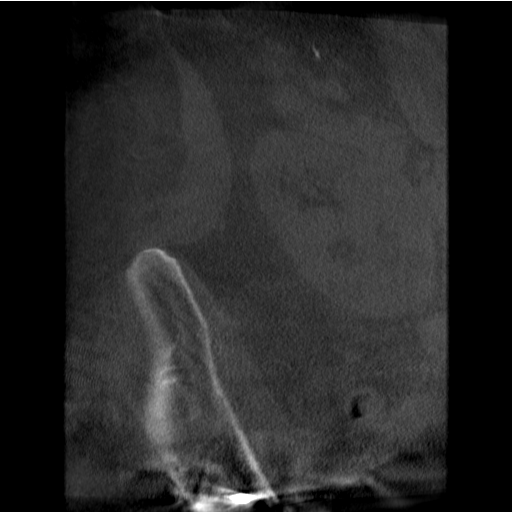
[im 57/169  bone]
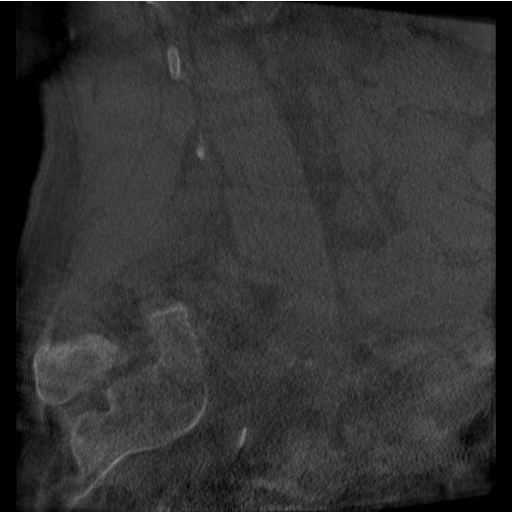
[im 85/169  bone]
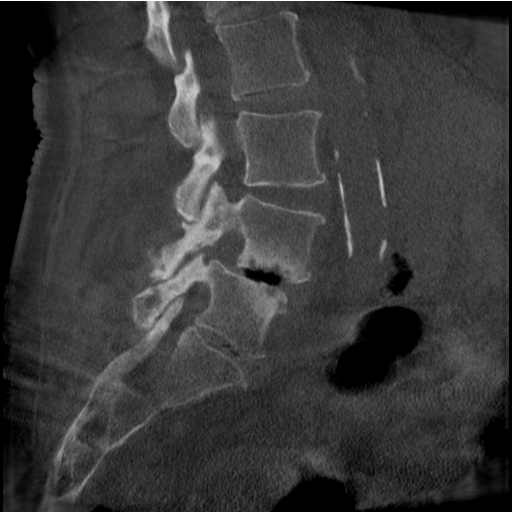
[im 113/169  bone]
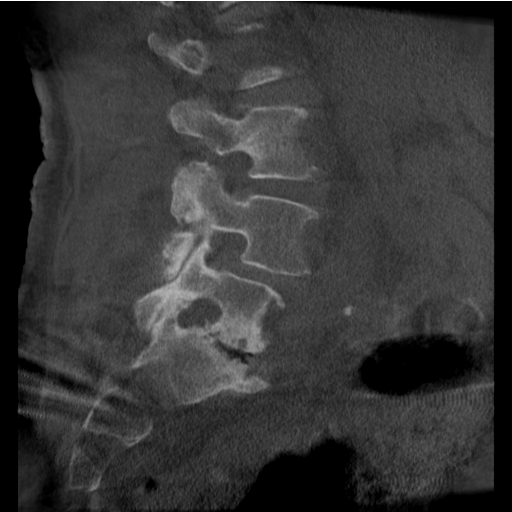
[im 141/169  bone]
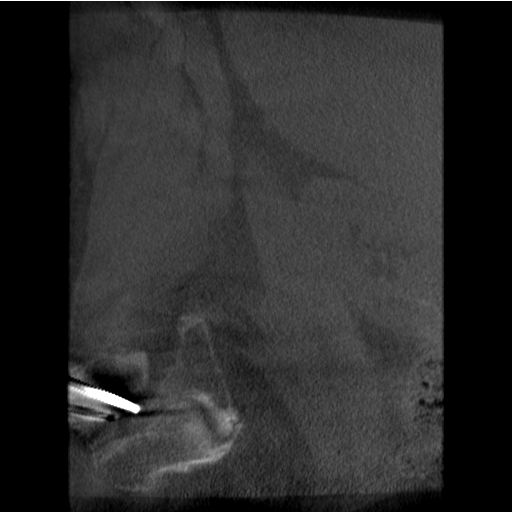

[Series 6: — · axial · 0.36mm/px · z∈[-61,+59]mm · 5 of 170 slices shown, 7 images (3 of 4)]
[im 29/170  soft-tissue]
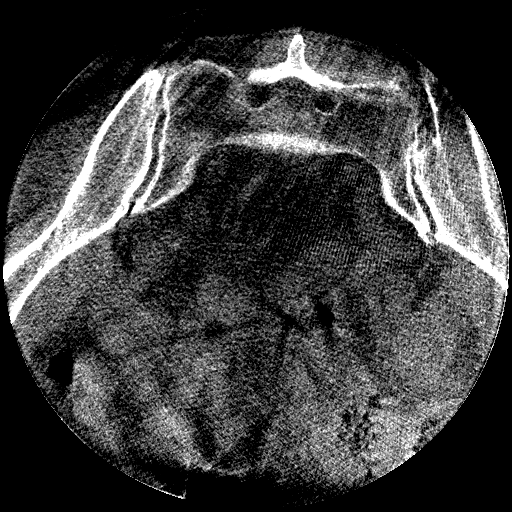
[im 29/170  bone]
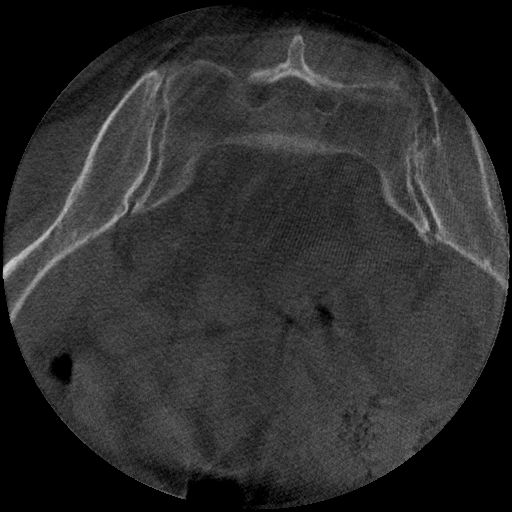
[im 57/170  bone]
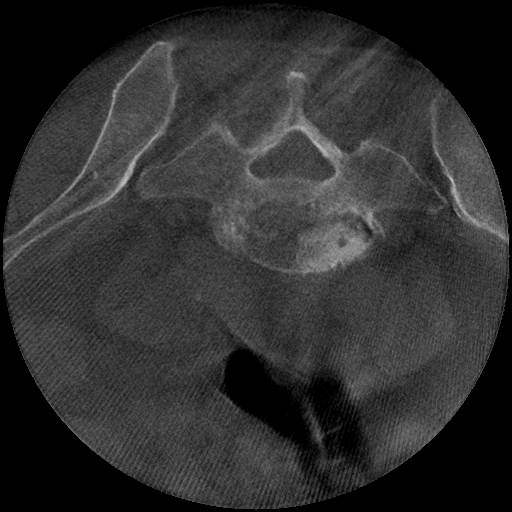
[im 85/170  bone]
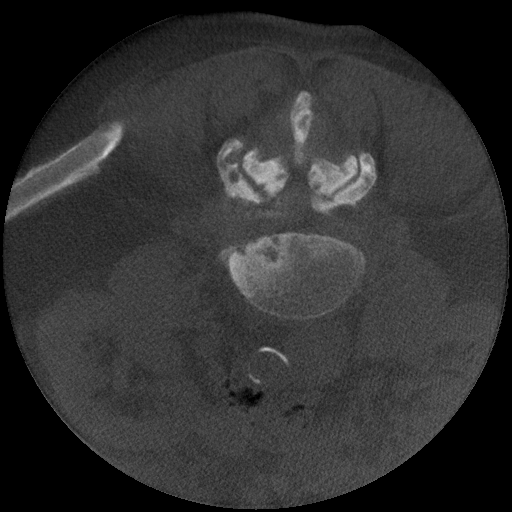
[im 113/170  bone]
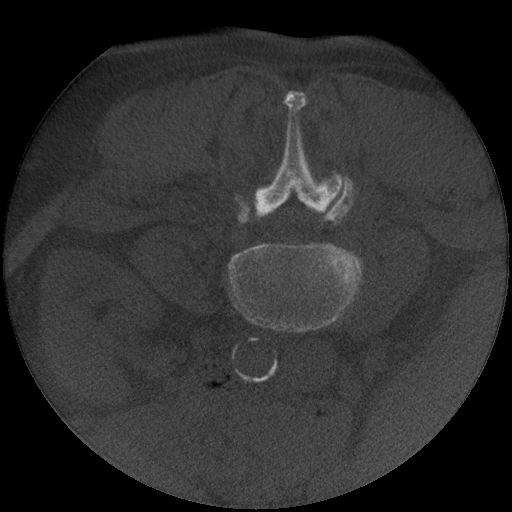
[im 141/170  soft-tissue]
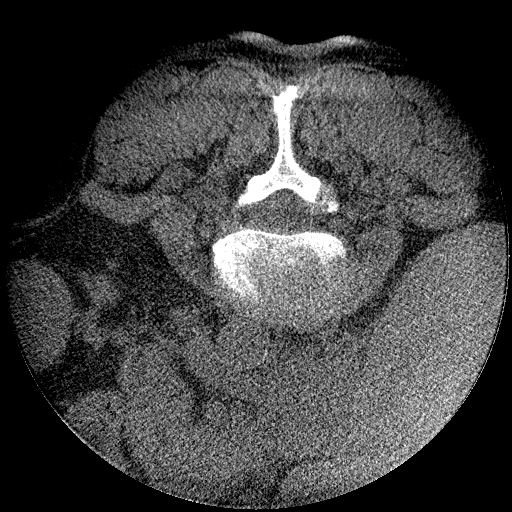
[im 141/170  bone]
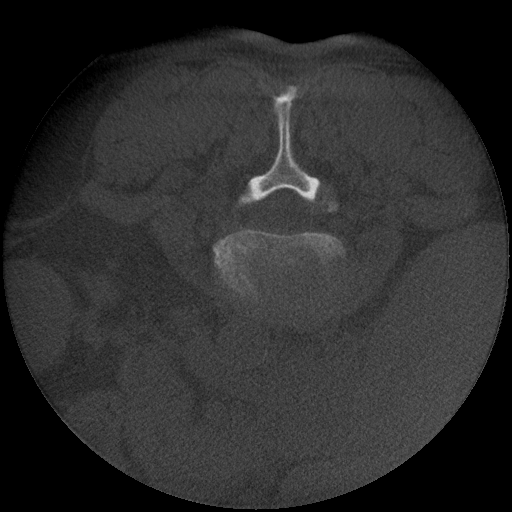

[Series 7: — · axial · 0.36mm/px · z∈[-61,+59]mm · 5 of 170 slices shown (4 of 4)]
[im 29/170  bone]
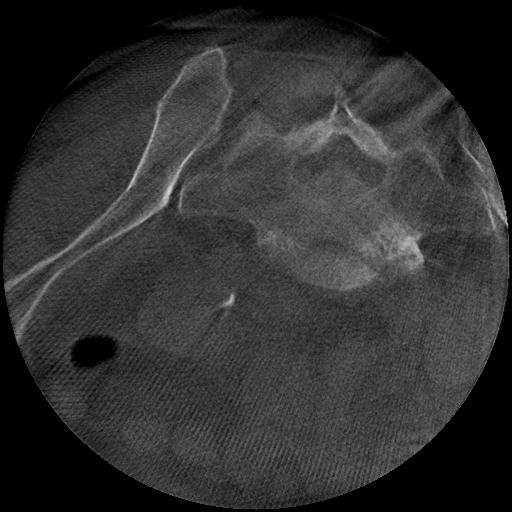
[im 57/170  bone]
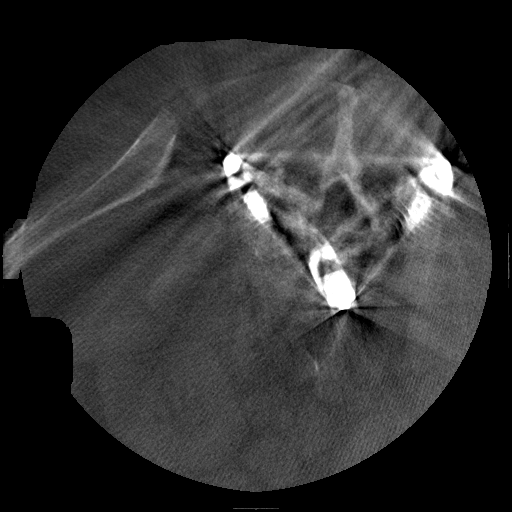
[im 85/170  bone]
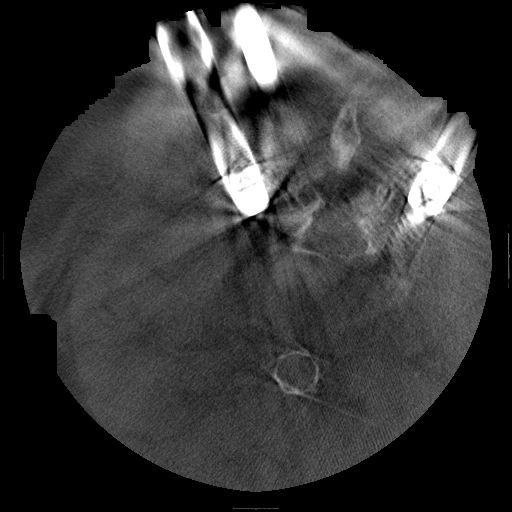
[im 113/170  bone]
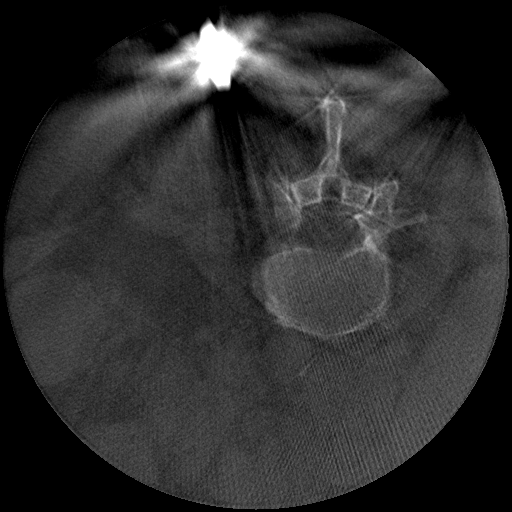
[im 141/170  bone]
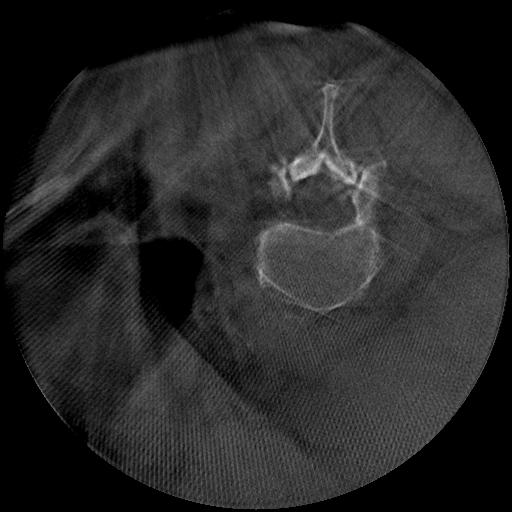

[16 of 33 positions shown; findings below may reference images not displayed]

FINDINGS: For C-arm fluoroscopic images and stereotactic CT were obtained
intraoperatively and submitted for post operative interpretation.
Initial image demonstrates multilevel degenerative change with grade
1 anterolisthesis of L4 on L5. Subsequent radiographs demonstrate
intra procedural images for L4-L5 posterior instrumented fusion with
interbody spacer placement. Hardware alignment is within expected
limits on the fourth image. The anterolisthesis appears improved.
Initial stereotactic CT image demonstrates grade 1 anterolisthesis
of L4 on L5 with vacuum disc phenomenon. Subsequent images
demonstrate posterior instrumented fusion at L4-L5 with interbody
spacer placement and improved anterolisthesis. Hardware alignment is
within expected limits, without evidence of complication. Fluoro
time 1 minutes 1 second. Please see the performing provider's
procedural report for further detail.
IMPRESSION: Status post L4-L5 posterior instrumented fusion without evidence of
immediate complication.

## 2024-04-27 ENCOUNTER — Other Ambulatory Visit: Payer: Self-pay | Admitting: Gerontology

## 2024-04-27 DIAGNOSIS — R109 Unspecified abdominal pain: Secondary | ICD-10-CM

## 2024-05-04 ENCOUNTER — Ambulatory Visit
Admission: RE | Admit: 2024-05-04 | Discharge: 2024-05-04 | Disposition: A | Discharge status: Home / Self Care | Source: Ambulatory Visit | Attending: Gerontology | Admitting: Gerontology

## 2024-05-04 DIAGNOSIS — R109 Unspecified abdominal pain: Secondary | ICD-10-CM | POA: Insufficient documentation
